# Patient Record
Sex: Male | Born: 1935 | Race: White | Hispanic: No | Smoking: Former smoker
Health system: Southern US, Community
[De-identification: ages and names within clinical notes are randomized; demographics above are authoritative.]

## PROBLEM LIST (undated history)

## (undated) DIAGNOSIS — E119 Type 2 diabetes mellitus without complications: Secondary | ICD-10-CM

## (undated) DIAGNOSIS — L80 Vitiligo: Secondary | ICD-10-CM

## (undated) DIAGNOSIS — D649 Anemia, unspecified: Secondary | ICD-10-CM

## (undated) DIAGNOSIS — G709 Myoneural disorder, unspecified: Secondary | ICD-10-CM

## (undated) DIAGNOSIS — K219 Gastro-esophageal reflux disease without esophagitis: Secondary | ICD-10-CM

## (undated) DIAGNOSIS — E785 Hyperlipidemia, unspecified: Secondary | ICD-10-CM

## (undated) DIAGNOSIS — H6120 Impacted cerumen, unspecified ear: Secondary | ICD-10-CM

## (undated) DIAGNOSIS — J309 Allergic rhinitis, unspecified: Secondary | ICD-10-CM

## (undated) DIAGNOSIS — K625 Hemorrhage of anus and rectum: Secondary | ICD-10-CM

## (undated) DIAGNOSIS — R05 Cough: Secondary | ICD-10-CM

## (undated) DIAGNOSIS — M1990 Unspecified osteoarthritis, unspecified site: Secondary | ICD-10-CM

## (undated) DIAGNOSIS — I10 Essential (primary) hypertension: Secondary | ICD-10-CM

## (undated) DIAGNOSIS — R972 Elevated prostate specific antigen [PSA]: Secondary | ICD-10-CM

## (undated) DIAGNOSIS — Z8719 Personal history of other diseases of the digestive system: Secondary | ICD-10-CM

## (undated) DIAGNOSIS — R339 Retention of urine, unspecified: Secondary | ICD-10-CM

## (undated) DIAGNOSIS — R259 Unspecified abnormal involuntary movements: Secondary | ICD-10-CM

## (undated) DIAGNOSIS — K4090 Unilateral inguinal hernia, without obstruction or gangrene, not specified as recurrent: Secondary | ICD-10-CM

## (undated) DIAGNOSIS — C4490 Unspecified malignant neoplasm of skin, unspecified: Secondary | ICD-10-CM

## (undated) DIAGNOSIS — J45909 Unspecified asthma, uncomplicated: Secondary | ICD-10-CM

## (undated) HISTORY — DX: Personal history of other diseases of the digestive system: Z87.19

## (undated) HISTORY — DX: Cough: R05

## (undated) HISTORY — DX: Impacted cerumen, unspecified ear: H61.20

## (undated) HISTORY — DX: Anemia, unspecified: D64.9

## (undated) HISTORY — DX: Unspecified abnormal involuntary movements: R25.9

## (undated) HISTORY — DX: Allergic rhinitis, unspecified: J30.9

## (undated) HISTORY — DX: Unspecified asthma, uncomplicated: J45.909

## (undated) HISTORY — DX: Vitiligo: L80

## (undated) HISTORY — DX: Type 2 diabetes mellitus without complications: E11.9

## (undated) HISTORY — DX: Hyperlipidemia, unspecified: E78.5

## (undated) HISTORY — DX: Essential (primary) hypertension: I10

## (undated) HISTORY — PX: CATARACT EXTRACTION: SUR2

## (undated) HISTORY — DX: Unspecified malignant neoplasm of skin, unspecified: C44.90

## (undated) HISTORY — DX: Gastro-esophageal reflux disease without esophagitis: K21.9

## (undated) HISTORY — DX: Unspecified osteoarthritis, unspecified site: M19.90

## (undated) HISTORY — DX: Unilateral inguinal hernia, without obstruction or gangrene, not specified as recurrent: K40.90

## (undated) HISTORY — DX: Retention of urine, unspecified: R33.9

## (undated) HISTORY — DX: Elevated prostate specific antigen (PSA): R97.20

## (undated) HISTORY — DX: Hemorrhage of anus and rectum: K62.5

## (undated) HISTORY — DX: Myoneural disorder, unspecified: G70.9

---
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
## 1958-02-25 HISTORY — PX: INNER EAR SURGERY: SHX679

## 2004-01-12 ENCOUNTER — Ambulatory Visit: Payer: Self-pay | Admitting: Endocrinology

## 2004-01-13 ENCOUNTER — Ambulatory Visit: Payer: Self-pay | Admitting: Endocrinology

## 2004-04-02 ENCOUNTER — Ambulatory Visit: Payer: Self-pay | Admitting: Endocrinology

## 2004-10-09 ENCOUNTER — Ambulatory Visit: Payer: Self-pay | Admitting: Endocrinology

## 2004-10-19 ENCOUNTER — Ambulatory Visit: Payer: Self-pay

## 2004-11-23 ENCOUNTER — Ambulatory Visit: Payer: Self-pay | Admitting: Endocrinology

## 2004-12-10 ENCOUNTER — Ambulatory Visit: Payer: Self-pay | Admitting: Cardiology

## 2005-01-09 ENCOUNTER — Ambulatory Visit: Payer: Self-pay | Admitting: Endocrinology

## 2005-04-17 ENCOUNTER — Ambulatory Visit: Payer: Self-pay | Admitting: Endocrinology

## 2005-04-23 ENCOUNTER — Ambulatory Visit: Payer: Self-pay | Admitting: Endocrinology

## 2005-06-11 ENCOUNTER — Ambulatory Visit: Payer: Self-pay | Admitting: Cardiology

## 2005-10-01 ENCOUNTER — Ambulatory Visit: Payer: Self-pay | Admitting: Endocrinology

## 2005-10-04 ENCOUNTER — Ambulatory Visit: Payer: Self-pay | Admitting: Endocrinology

## 2005-11-28 ENCOUNTER — Ambulatory Visit: Payer: Self-pay | Admitting: Cardiology

## 2006-01-21 ENCOUNTER — Ambulatory Visit: Payer: Self-pay | Admitting: Endocrinology

## 2006-03-14 ENCOUNTER — Ambulatory Visit: Payer: Self-pay | Admitting: Endocrinology

## 2006-05-08 ENCOUNTER — Ambulatory Visit: Payer: Self-pay | Admitting: Endocrinology

## 2006-05-08 LAB — CONVERTED CEMR LAB
ALT: 49 units/L — ABNORMAL HIGH (ref 0–40)
Albumin: 3.8 g/dL (ref 3.5–5.2)
BUN: 36 mg/dL — ABNORMAL HIGH (ref 6–23)
Basophils Relative: 2.2 % — ABNORMAL HIGH (ref 0.0–1.0)
CO2: 26 meq/L (ref 19–32)
Chloride: 108 meq/L (ref 96–112)
Cholesterol: 121 mg/dL (ref 0–200)
Creatinine, Ser: 1.3 mg/dL (ref 0.4–1.5)
GFR calc Af Amer: 69 mL/min
GFR calc non Af Amer: 57 mL/min
Glucose, Bld: 104 mg/dL — ABNORMAL HIGH (ref 70–99)
LDL Cholesterol: 62 mg/dL (ref 0–99)
Leukocytes, UA: NEGATIVE
Lymphocytes Relative: 23.7 % (ref 12.0–46.0)
MCV: 91.6 fL (ref 78.0–100.0)
Microalb Creat Ratio: 57.4 mg/g — ABNORMAL HIGH (ref 0.0–30.0)
Monocytes Relative: 10.1 % (ref 3.0–11.0)
Neutro Abs: 3.9 10*3/uL (ref 1.4–7.7)
Neutrophils Relative %: 49.5 % (ref 43.0–77.0)
Nitrite: NEGATIVE
Platelets: 235 10*3/uL (ref 150–400)
RBC: 4.56 M/uL (ref 4.22–5.81)
RDW: 12.9 % (ref 11.5–14.6)
Sodium: 142 meq/L (ref 135–145)
TSH: 1.82 microintl units/mL (ref 0.35–5.50)
Total CHOL/HDL Ratio: 3.5
Triglycerides: 121 mg/dL (ref 0–149)
Urine Glucose: NEGATIVE mg/dL
Urobilinogen, UA: 0.2 (ref 0.0–1.0)

## 2006-05-13 ENCOUNTER — Ambulatory Visit: Payer: Self-pay | Admitting: Endocrinology

## 2006-06-16 ENCOUNTER — Ambulatory Visit: Payer: Self-pay | Admitting: Cardiology

## 2006-09-22 ENCOUNTER — Encounter: Payer: Self-pay | Admitting: Endocrinology

## 2006-09-22 DIAGNOSIS — J309 Allergic rhinitis, unspecified: Secondary | ICD-10-CM | POA: Insufficient documentation

## 2006-09-22 DIAGNOSIS — E785 Hyperlipidemia, unspecified: Secondary | ICD-10-CM | POA: Insufficient documentation

## 2006-09-22 DIAGNOSIS — D649 Anemia, unspecified: Secondary | ICD-10-CM

## 2006-09-22 DIAGNOSIS — K219 Gastro-esophageal reflux disease without esophagitis: Secondary | ICD-10-CM | POA: Insufficient documentation

## 2006-09-22 DIAGNOSIS — Z8719 Personal history of other diseases of the digestive system: Secondary | ICD-10-CM | POA: Insufficient documentation

## 2006-09-22 DIAGNOSIS — I10 Essential (primary) hypertension: Secondary | ICD-10-CM

## 2006-09-22 DIAGNOSIS — E119 Type 2 diabetes mellitus without complications: Secondary | ICD-10-CM

## 2006-09-22 HISTORY — DX: Essential (primary) hypertension: I10

## 2006-09-22 HISTORY — DX: Type 2 diabetes mellitus without complications: E11.9

## 2006-09-22 HISTORY — DX: Anemia, unspecified: D64.9

## 2006-09-22 HISTORY — DX: Gastro-esophageal reflux disease without esophagitis: K21.9

## 2006-09-22 HISTORY — DX: Allergic rhinitis, unspecified: J30.9

## 2006-09-22 HISTORY — DX: Hyperlipidemia, unspecified: E78.5

## 2006-09-22 HISTORY — DX: Personal history of other diseases of the digestive system: Z87.19

## 2006-12-05 ENCOUNTER — Ambulatory Visit: Payer: Self-pay | Admitting: Cardiology

## 2007-04-14 ENCOUNTER — Encounter: Payer: Self-pay | Admitting: Endocrinology

## 2007-04-21 ENCOUNTER — Telehealth (INDEPENDENT_AMBULATORY_CARE_PROVIDER_SITE_OTHER): Payer: Self-pay | Admitting: *Deleted

## 2007-04-23 ENCOUNTER — Ambulatory Visit: Payer: Self-pay | Admitting: Endocrinology

## 2007-04-23 DIAGNOSIS — R259 Unspecified abnormal involuntary movements: Secondary | ICD-10-CM | POA: Insufficient documentation

## 2007-04-23 HISTORY — DX: Unspecified abnormal involuntary movements: R25.9

## 2007-04-29 LAB — CONVERTED CEMR LAB
AST: 30 units/L (ref 0–37)
BUN: 33 mg/dL — ABNORMAL HIGH (ref 6–23)
Calcium: 9 mg/dL (ref 8.4–10.5)
Creatinine, Ser: 1.3 mg/dL (ref 0.4–1.5)
Creatinine,U: 79.6 mg/dL
Direct LDL: 80.9 mg/dL
GFR calc non Af Amer: 57 mL/min
Glucose, Bld: 100 mg/dL — ABNORMAL HIGH (ref 70–99)
Microalb Creat Ratio: 228.6 mg/g — ABNORMAL HIGH (ref 0.0–30.0)
Potassium: 4.7 meq/L (ref 3.5–5.1)
Sodium: 140 meq/L (ref 135–145)
Total Bilirubin: 0.8 mg/dL (ref 0.3–1.2)

## 2007-06-12 ENCOUNTER — Encounter: Payer: Self-pay | Admitting: Endocrinology

## 2007-07-22 ENCOUNTER — Ambulatory Visit: Payer: Self-pay | Admitting: Cardiology

## 2007-10-19 ENCOUNTER — Encounter: Payer: Self-pay | Admitting: Endocrinology

## 2007-10-19 ENCOUNTER — Telehealth: Payer: Self-pay | Admitting: Endocrinology

## 2007-11-06 ENCOUNTER — Ambulatory Visit: Payer: Self-pay | Admitting: Endocrinology

## 2007-11-06 DIAGNOSIS — M1990 Unspecified osteoarthritis, unspecified site: Secondary | ICD-10-CM

## 2007-11-06 DIAGNOSIS — H6120 Impacted cerumen, unspecified ear: Secondary | ICD-10-CM | POA: Insufficient documentation

## 2007-11-06 DIAGNOSIS — L80 Vitiligo: Secondary | ICD-10-CM

## 2007-11-06 DIAGNOSIS — R972 Elevated prostate specific antigen [PSA]: Secondary | ICD-10-CM | POA: Insufficient documentation

## 2007-11-06 HISTORY — DX: Elevated prostate specific antigen (PSA): R97.20

## 2007-11-06 HISTORY — DX: Unspecified osteoarthritis, unspecified site: M19.90

## 2007-11-06 HISTORY — DX: Vitiligo: L80

## 2007-11-06 HISTORY — DX: Impacted cerumen, unspecified ear: H61.20

## 2007-11-07 LAB — CONVERTED CEMR LAB
ALT: 19 units/L (ref 0–53)
Alkaline Phosphatase: 55 units/L (ref 39–117)
BUN: 29 mg/dL — ABNORMAL HIGH (ref 6–23)
Bacteria, UA: NEGATIVE
Basophils Absolute: 0.1 10*3/uL (ref 0.0–0.1)
Basophils Relative: 1.1 % (ref 0.0–3.0)
Bilirubin Urine: NEGATIVE
Bilirubin, Direct: 0.1 mg/dL (ref 0.0–0.3)
CO2: 33 meq/L — ABNORMAL HIGH (ref 19–32)
Calcium: 9.2 mg/dL (ref 8.4–10.5)
Chloride: 99 meq/L (ref 96–112)
Cholesterol: 152 mg/dL (ref 0–200)
Creatinine, Ser: 1.2 mg/dL (ref 0.4–1.5)
Creatinine,U: 72.8 mg/dL
Crystals: NEGATIVE
Direct LDL: 65.8 mg/dL
Folate: >20 ng/mL
GFR calc non Af Amer: 62 mL/min
HCT: 46 % (ref 39.0–52.0)
Leukocytes, UA: NEGATIVE
Lymphocytes Relative: 23.9 % (ref 12.0–46.0)
MCHC: 33.9 g/dL (ref 30.0–36.0)
Neutrophils Relative %: 48.9 % (ref 43.0–77.0)
Nitrite: NEGATIVE
Platelets: 239 10*3/uL (ref 150–400)
RDW: 12.9 % (ref 11.5–14.6)
TSH: 1.14 microintl units/mL (ref 0.35–5.50)
Total Protein: 8 g/dL (ref 6.0–8.3)
Triglycerides: 245 mg/dL (ref 0–149)
Uric Acid, Serum: 6.7 mg/dL (ref 4.0–7.8)
Urine Glucose: NEGATIVE mg/dL
Urobilinogen, UA: 0.2 (ref 0.0–1.0)
VLDL: 49 mg/dL — ABNORMAL HIGH (ref 0–40)
pH: 7 (ref 5.0–8.0)

## 2007-11-13 ENCOUNTER — Encounter: Payer: Self-pay | Admitting: Endocrinology

## 2007-11-25 ENCOUNTER — Telehealth: Payer: Self-pay | Admitting: Endocrinology

## 2007-12-25 ENCOUNTER — Ambulatory Visit: Payer: Self-pay | Admitting: Endocrinology

## 2007-12-31 ENCOUNTER — Ambulatory Visit: Payer: Self-pay | Admitting: Cardiology

## 2008-02-01 ENCOUNTER — Ambulatory Visit: Payer: Self-pay | Admitting: Endocrinology

## 2008-02-01 DIAGNOSIS — R059 Cough, unspecified: Secondary | ICD-10-CM | POA: Insufficient documentation

## 2008-02-01 DIAGNOSIS — R05 Cough: Secondary | ICD-10-CM | POA: Insufficient documentation

## 2008-02-01 HISTORY — DX: Cough, unspecified: R05.9

## 2008-04-27 ENCOUNTER — Encounter: Payer: Self-pay | Admitting: Endocrinology

## 2008-05-20 ENCOUNTER — Encounter: Payer: Self-pay | Admitting: Endocrinology

## 2008-06-30 ENCOUNTER — Encounter: Payer: Self-pay | Admitting: Endocrinology

## 2008-10-25 ENCOUNTER — Encounter (INDEPENDENT_AMBULATORY_CARE_PROVIDER_SITE_OTHER): Payer: Self-pay | Admitting: *Deleted

## 2008-11-17 ENCOUNTER — Encounter: Payer: Self-pay | Admitting: Endocrinology

## 2008-12-07 ENCOUNTER — Ambulatory Visit: Payer: Self-pay | Admitting: Endocrinology

## 2008-12-19 ENCOUNTER — Emergency Department (HOSPITAL_COMMUNITY): Admission: EM | Admit: 2008-12-19 | Discharge: 2008-12-19 | Payer: Self-pay | Admitting: Emergency Medicine

## 2008-12-20 ENCOUNTER — Ambulatory Visit: Payer: Self-pay | Admitting: Endocrinology

## 2008-12-20 DIAGNOSIS — K4090 Unilateral inguinal hernia, without obstruction or gangrene, not specified as recurrent: Secondary | ICD-10-CM | POA: Insufficient documentation

## 2008-12-20 HISTORY — DX: Unilateral inguinal hernia, without obstruction or gangrene, not specified as recurrent: K40.90

## 2008-12-21 LAB — CONVERTED CEMR LAB
AST: 23 units/L (ref 0–37)
BUN: 35 mg/dL — ABNORMAL HIGH (ref 6–23)
Basophils Absolute: 0 10*3/uL (ref 0.0–0.1)
Basophils Relative: 0.7 % (ref 0.0–3.0)
Bilirubin, Direct: 0.1 mg/dL (ref 0.0–0.3)
CO2: 28 meq/L (ref 19–32)
Calcium: 9 mg/dL (ref 8.4–10.5)
Cholesterol: 137 mg/dL (ref 0–200)
Creatinine, Ser: 1.5 mg/dL (ref 0.4–1.5)
Eosinophils Absolute: 0.4 10*3/uL (ref 0.0–0.7)
Eosinophils Relative: 5.6 % — ABNORMAL HIGH (ref 0.0–5.0)
Folate: >20 ng/mL
GFR calc non Af Amer: 47.73 mL/min (ref >60)
HDL: 34.2 mg/dL — ABNORMAL LOW (ref >39.00)
Iron: 111 ug/dL (ref 42–165)
LDL Cholesterol: 70 mg/dL (ref 0–99)
Microalb Creat Ratio: 25.2 mg/g (ref 0.0–30.0)
Monocytes Absolute: 0.7 10*3/uL (ref 0.1–1.0)
PSA: 5.21 ng/mL — ABNORMAL HIGH (ref 0.10–4.00)
Platelets: 186 10*3/uL (ref 150.0–400.0)
RBC: 4.25 M/uL (ref 4.22–5.81)
RDW: 12.6 % (ref 11.5–14.6)
Specific Gravity, Urine: 1.015 (ref 1.000–1.030)
TSH: 1.33 microintl units/mL (ref 0.35–5.50)
Total CHOL/HDL Ratio: 4
Total Protein, Urine: NEGATIVE mg/dL
Urine Glucose: NEGATIVE mg/dL
Vitamin B-12: 566 pg/mL (ref 211–911)
pH: 6.5 (ref 5.0–8.0)

## 2008-12-30 ENCOUNTER — Ambulatory Visit: Payer: Self-pay | Admitting: Endocrinology

## 2009-01-02 ENCOUNTER — Telehealth: Payer: Self-pay | Admitting: Endocrinology

## 2009-01-03 ENCOUNTER — Telehealth: Payer: Self-pay | Admitting: Endocrinology

## 2009-01-04 ENCOUNTER — Encounter: Payer: Self-pay | Admitting: Endocrinology

## 2009-01-04 DIAGNOSIS — C4490 Unspecified malignant neoplasm of skin, unspecified: Secondary | ICD-10-CM

## 2009-01-04 HISTORY — DX: Unspecified malignant neoplasm of skin, unspecified: C44.90

## 2009-01-06 ENCOUNTER — Encounter (INDEPENDENT_AMBULATORY_CARE_PROVIDER_SITE_OTHER): Payer: Self-pay | Admitting: *Deleted

## 2009-01-11 ENCOUNTER — Telehealth (INDEPENDENT_AMBULATORY_CARE_PROVIDER_SITE_OTHER): Payer: Self-pay | Admitting: *Deleted

## 2009-01-16 ENCOUNTER — Telehealth: Payer: Self-pay | Admitting: Internal Medicine

## 2009-01-16 ENCOUNTER — Encounter: Payer: Self-pay | Admitting: Endocrinology

## 2009-01-23 ENCOUNTER — Ambulatory Visit: Payer: Self-pay | Admitting: Cardiology

## 2009-01-30 ENCOUNTER — Encounter: Payer: Self-pay | Admitting: Endocrinology

## 2009-02-08 ENCOUNTER — Ambulatory Visit (HOSPITAL_COMMUNITY): Admission: RE | Admit: 2009-02-08 | Discharge: 2009-02-08 | Payer: Self-pay | Admitting: Surgery

## 2009-06-22 ENCOUNTER — Ambulatory Visit: Payer: Self-pay | Admitting: Endocrinology

## 2009-06-22 DIAGNOSIS — M542 Cervicalgia: Secondary | ICD-10-CM | POA: Insufficient documentation

## 2009-07-21 ENCOUNTER — Encounter: Payer: Self-pay | Admitting: Endocrinology

## 2009-07-25 ENCOUNTER — Telehealth (INDEPENDENT_AMBULATORY_CARE_PROVIDER_SITE_OTHER): Payer: Self-pay | Admitting: *Deleted

## 2009-09-28 ENCOUNTER — Telehealth: Payer: Self-pay | Admitting: Endocrinology

## 2009-09-29 ENCOUNTER — Ambulatory Visit: Payer: Self-pay | Admitting: Endocrinology

## 2009-10-04 ENCOUNTER — Encounter: Payer: Self-pay | Admitting: Endocrinology

## 2009-10-05 ENCOUNTER — Telehealth: Payer: Self-pay | Admitting: Endocrinology

## 2009-10-19 ENCOUNTER — Encounter: Payer: Self-pay | Admitting: Endocrinology

## 2010-01-04 ENCOUNTER — Encounter: Payer: Self-pay | Admitting: Endocrinology

## 2010-03-21 ENCOUNTER — Encounter: Payer: Self-pay | Admitting: Endocrinology

## 2010-03-21 ENCOUNTER — Ambulatory Visit
Admission: RE | Admit: 2010-03-21 | Discharge: 2010-03-21 | Payer: Self-pay | Source: Home / Self Care | Attending: Endocrinology | Admitting: Endocrinology

## 2010-03-26 ENCOUNTER — Emergency Department (HOSPITAL_COMMUNITY)
Admission: EM | Admit: 2010-03-26 | Discharge: 2010-03-26 | Payer: Self-pay | Source: Home / Self Care | Admitting: Emergency Medicine

## 2010-03-26 ENCOUNTER — Encounter (INDEPENDENT_AMBULATORY_CARE_PROVIDER_SITE_OTHER): Payer: Self-pay | Admitting: *Deleted

## 2010-03-26 LAB — URINALYSIS, ROUTINE W REFLEX MICROSCOPIC
Bilirubin Urine: NEGATIVE
Hgb urine dipstick: NEGATIVE
Protein, ur: 100 mg/dL — AB
Urine Glucose, Fasting: NEGATIVE mg/dL
Urobilinogen, UA: 0.2 mg/dL (ref 0.0–1.0)

## 2010-03-26 LAB — COMPREHENSIVE METABOLIC PANEL
AST: 19 U/L (ref 0–37)
Albumin: 3.7 g/dL (ref 3.5–5.2)
Alkaline Phosphatase: 62 U/L (ref 39–117)
BUN: 21 mg/dL (ref 6–23)
CO2: 26 mEq/L (ref 19–32)
Chloride: 107 mEq/L (ref 96–112)
GFR calc Af Amer: >60 mL/min (ref >60)
GFR calc non Af Amer: 57 mL/min — ABNORMAL LOW (ref >60)
Potassium: 3.7 mEq/L (ref 3.5–5.1)
Total Bilirubin: 0.9 mg/dL (ref 0.3–1.2)

## 2010-03-26 LAB — DIFFERENTIAL
Basophils Absolute: 0 10*3/uL (ref 0.0–0.1)
Basophils Relative: 0 % (ref 0–1)
Lymphocytes Relative: 14 % (ref 12–46)
Monocytes Relative: 14 % — ABNORMAL HIGH (ref 3–12)
Neutro Abs: 7.6 10*3/uL (ref 1.7–7.7)
Neutrophils Relative %: 68 % (ref 43–77)

## 2010-03-26 LAB — CBC
Hemoglobin: 14.9 g/dL (ref 13.0–17.0)
MCV: 92 fL (ref 78.0–100.0)
Platelets: 206 10*3/uL (ref 150–400)
RBC: 4.63 MIL/uL (ref 4.22–5.81)
WBC: 11.3 10*3/uL — ABNORMAL HIGH (ref 4.0–10.5)

## 2010-03-29 ENCOUNTER — Encounter: Payer: Self-pay | Admitting: Endocrinology

## 2010-03-29 ENCOUNTER — Ambulatory Visit (INDEPENDENT_AMBULATORY_CARE_PROVIDER_SITE_OTHER): Payer: Medicare Other | Admitting: Endocrinology

## 2010-03-29 DIAGNOSIS — R109 Unspecified abdominal pain: Secondary | ICD-10-CM

## 2010-03-29 NOTE — Miscellaneous (Signed)
  Medications Added VYTORIN 10-20 MG TABS (EZETIMIBE-SIMVASTATIN) 1 once daily       Clinical Lists Changes  Medications: Removed medication of VYTORIN 10-80 MG  TABS (EZETIMIBE-SIMVASTATIN) take 1 by mouth qd Added new medication of VYTORIN 10-20 MG TABS (EZETIMIBE-SIMVASTATIN) 1 once daily - Signed Rx of VYTORIN 10-20 MG TABS (EZETIMIBE-SIMVASTATIN) 1 once daily;  #30 x 11;  Signed;  Entered by: Minus Breeding MD;  Authorized by: Minus Breeding MD;  Method used: Electronically to [facility] [address] [phone]*, [address] [place], [place], [place]  [address], Ph: [phone] or [phone], Fax: [phone]    Prescriptions: VYTORIN 10-20 MG TABS (EZETIMIBE-SIMVASTATIN) 1 once daily  #30 x 11   Entered and Authorized by:   Minus Breeding MD   Signed by:   Minus Breeding MD on 10/04/2009   Method used:   Electronically to        [facility] [address]* (retail)       [address]       [place], [place]  [address]       Ph: [phone] or [phone]       Fax: [phone]   RxID:   [id number]

## 2010-03-29 NOTE — Progress Notes (Signed)
Summary: [facility]  Phone Note Outgoing Call   Summary of Call: Faxed completed paperwork to [facility] medical and sent a copy to be scanned. Initial call taken by: Josph Macho RMA,  Jul 25, 2009 1:04 PM

## 2010-03-29 NOTE — Medication Information (Signed)
Summary: Glucose Testing/[facility]  Glucose Testing/[facility]   Imported By: Sherian Rein 07/27/2009 11:50:08  _____________________________________________________________________  External Attachment:    Type:   Image     Comment:   External Document

## 2010-03-29 NOTE — Letter (Signed)
Summary: [facility]  [facility]   Imported By: Lennie Odor 01/09/2010 10:50:04  _____________________________________________________________________  External Attachment:    Type:   Image     Comment:   External Document

## 2010-03-29 NOTE — Progress Notes (Signed)
Summary: Vytorin  Phone Note Outgoing Call   Call placed by: Brenton Grills MA,  October 05, 2009 9:09 AM Summary of Call: Called pt to inform that medication dosage was changed due to possible drug interaction (Vytorin 10-20mg .) Also informed pt that new rx was sent in to pharmacy and notified pharmacy as well. Pt verbalized understanding.

## 2010-03-29 NOTE — Letter (Signed)
Summary: [facility]   Imported By: Sherian Rein 10/26/2009 14:07:55  _____________________________________________________________________  External Attachment:    Type:   Image     Comment:   External Document

## 2010-03-29 NOTE — Assessment & Plan Note (Signed)
Summary: FOLLOW UP-LB   Vital Signs:  Patient profile:   77 year old male Height:      69 inches (175.26 cm) Weight:      159.13 pounds (72.33 kg) BMI:     23.58 O2 Sat:      98 % on Room air Temp:     97.2 degrees F (36.22 degrees C) oral Pulse rate:   59 / minute BP sitting:   112 / 78  (left arm) Cuff size:   regular  Vitals Entered By: Brenton Grills MA (September 29, 2009 10:36 AM)  O2 Flow:  Room air CC: F/U appt/aj Is Patient Diabetic? Yes   Primary Provider:  Minus Breeding MD  CC:  F/U appt/aj.  History of Present Illness: 1 week of productive-quality cough in the chest, but no associated sob.  no sore throat or earache.  he has slight nasal congestion.  Current Medications (verified): 1)  Fish Oil 1200 Mg  Caps (Omega-3 Fatty Acids) .... Take 1 By Mouth Bid 2)  Actos 30 Mg  Tabs (Pioglitazone Hcl) .... Take 1 By Mouth Qd 3)  Lisinopril-Hydrochlorothiazide 20-12.5 Mg  Tabs (Lisinopril-Hydrochlorothiazide) .... Take 1 By Mouth Qd 4)  Flomax 0.4 Mg  Cp24 (Tamsulosin Hcl) .... Take 1 By Mouth Bid 5)  Vytorin 10-80 Mg  Tabs (Ezetimibe-Simvastatin) .... Take 1 By Mouth Qd 6)  Topiramate 25 Mg Tabs (Topiramate) .... 4 Tabs Twice Daily 7)  Metoprolol Succinate 100 Mg Xr24h-Tab (Metoprolol Succinate) .... 1/2 Qd 8)  Celebrex 200 Mg Caps (Celecoxib) .[name] Kitchen.. 1 Qd 9)  Amlodipine Besylate 5 Mg Tabs (Amlodipine Besylate) .[name] Kitchen.. 1 Qd 10)  Aspirin 81 Mg  Tabs (Aspirin) .[name] Kitchen.. 1 By Mouth Daily 11)  Tramadol Hcl 50 Mg Tabs (Tramadol Hcl) .[name] Kitchen.. 1 Every 4 Hrn As Needed For Pain  Allergies (verified): 1)  ! Septra 2)  ! Niacin  Past History:  Past Medical History: Last updated: 01/23/2009 HYPERTENSION (ICD-401.9) HYPERLIPIDEMIA (ICD-272.4) GERD (ICD-530.81) DIABETES MELLITUS, TYPE II (ICD-250.00) CARCINOMA, SKIN, SQUAMOUS CELL (ICD-173.9) INGUINAL HERNIA, RIGHT (ICD-550.90) COUGH (ICD-786.2) ROUTINE GENERAL MEDICAL EXAM@HEALTH  CARE FACL (ICD-V70.0) IMPACTED CERUMEN  (ICD-380.4) OSTEOARTHRITIS (ICD-715.90) PSA, INCREASED (ICD-790.93) VITILIGO (ICD-709.01) TREMOR (ICD-781.0) ENCOUNTER FOR LONG-TERM USE OF OTHER MEDICATIONS (ICD-V58.69) DIVERTICULITIS, HX OF (ICD-V12.79) ANEMIA-NOS (ICD-285.9) ALLERGIC RHINITIS (ICD-477.9)  Review of Systems  The patient denies fever.    Physical Exam  Head:  head: no deformity eyes: no periorbital swelling, no proptosis external nose and ears are normal mouth: no lesion seen Lungs:  Clear to auscultation bilaterally. Normal respiratory effort.    Impression & Recommendations:  Problem # 1:  COUGH (ICD-786.2) due to acute bronchitis  Medications Added to Medication List This Visit: 1)  Cefuroxime Axetil 500 Mg Tabs (Cefuroxime axetil) .... 1/2 tab two times a day  Other Orders: T-2 View CXR (71020TC) Est. Patient Level III ([address])  Patient Instructions: 1)  Please schedule a physical appointment in 3 months. 2)  cefuroxime 1/2 of 500 mg two times a day. 3)  chest x ray today.  please call [phone] to hear your test results. Prescriptions: CEFUROXIME AXETIL 500 MG TABS (CEFUROXIME AXETIL) 1/2 tab two times a day  #7 x 0   Entered and Authorized by:   Minus Breeding MD   Signed by:   Minus Breeding MD on 09/29/2009   Method used:   Electronically to        [facility] # [phone]* (retail)  [address] [place], [place]  [address]       Ph: [phone]       Fax: [phone]   RxID:   [id number]

## 2010-03-29 NOTE — Progress Notes (Signed)
Summary: OV due  Phone Note Outgoing Call   Call placed by: Brenton Grills MA,  September 28, 2009 11:19 AM Details for Reason: OV due Summary of Call: Per MD, pt is due for OV. Called pt to set up OV.  Follow-up for Phone Call        Appointment scheduled 09/29/2009 10:15 am Follow-up by: Brenton Grills MA,  September 28, 2009 11:20 AM

## 2010-03-29 NOTE — Assessment & Plan Note (Signed)
Summary: NECK PAIN/NWS   Vital Signs:  Patient profile:   77 year old male Height:      69 inches (175.26 cm) Weight:      162.38 pounds (73.81 kg) O2 Sat:      97 % on Room air Temp:     97.5 degrees F (36.39 degrees C) oral Pulse rate:   60 / minute BP sitting:   124 / 70  (left arm) Cuff size:   regular  Vitals Entered By: Josph Macho RMA (June 22, 2009 1:13 PM)  O2 Flow:  Room air CC: Neck Pain X2 weeks/ CF Is Patient Diabetic? Yes   Primary Provider:  Minus Breeding MD  CC:  Neck Pain X2 weeks/ CF.  History of Present Illness: pt states 2 weeks of moderate pain at the back of the neck.  it is worse in the context of twisting the neck.  no associated numbness. no cbg record, but states cbg's are well-controlled.  Current Medications (verified): 1)  Fish Oil 1200 Mg  Caps (Omega-3 Fatty Acids) .... Take 1 By Mouth Bid 2)  Actos 30 Mg  Tabs (Pioglitazone Hcl) .... Take 1 By Mouth Qd 3)  Lisinopril-Hydrochlorothiazide 20-12.5 Mg  Tabs (Lisinopril-Hydrochlorothiazide) .... Take 1 By Mouth Qd 4)  Flomax 0.4 Mg  Cp24 (Tamsulosin Hcl) .... Take 1 By Mouth Bid 5)  Vytorin 10-80 Mg  Tabs (Ezetimibe-Simvastatin) .... Take 1 By Mouth Qd 6)  Topiramate 25 Mg Tabs (Topiramate) .... 4 Tabs Twice Daily 7)  Metoprolol Succinate 100 Mg Xr24h-Tab (Metoprolol Succinate) .... 1/2 Qd 8)  Celebrex 200 Mg Caps (Celecoxib) .[name] Kitchen.. 1 Qd 9)  Amlodipine Besylate 5 Mg Tabs (Amlodipine Besylate) .[name] Kitchen.. 1 Qd 10)  Aspirin 81 Mg  Tabs (Aspirin) .[name] Kitchen.. 1 By Mouth Daily  Allergies (verified): 1)  ! Septra 2)  ! Niacin  Past History:  Past Medical History: Last updated: 01/23/2009 HYPERTENSION (ICD-401.9) HYPERLIPIDEMIA (ICD-272.4) GERD (ICD-530.81) DIABETES MELLITUS, TYPE II (ICD-250.00) CARCINOMA, SKIN, SQUAMOUS CELL (ICD-173.9) INGUINAL HERNIA, RIGHT (ICD-550.90) COUGH (ICD-786.2) ROUTINE GENERAL MEDICAL EXAM@HEALTH  CARE FACL (ICD-V70.0) IMPACTED CERUMEN (ICD-380.4) OSTEOARTHRITIS  (ICD-715.90) PSA, INCREASED (ICD-790.93) VITILIGO (ICD-709.01) TREMOR (ICD-781.0) ENCOUNTER FOR LONG-TERM USE OF OTHER MEDICATIONS (ICD-V58.69) DIVERTICULITIS, HX OF (ICD-V12.79) ANEMIA-NOS (ICD-285.9) ALLERGIC RHINITIS (ICD-477.9)  Review of Systems  The patient denies weight loss and weight gain.    Physical Exam  General:  elderly, frail, no distress  Neck:  Supple without thyroid enlargement or tenderness. No cervical lymphadenopathy, neck masses or tracheal deviation.  full rom, but rom is painful Msk:  strength is decreased on the hands bilaterally (chronic). Neurologic:  sensation is intact to touch on the ue's.   Impression & Recommendations:  Problem # 1:  NECK PAIN (ICD-723.1) Assessment New  Problem # 2:  DIABETES MELLITUS, TYPE II (ICD-250.00) ? control  Medications Added to Medication List This Visit: 1)  Tramadol Hcl 50 Mg Tabs (Tramadol hcl) .[name] Kitchen.. 1 every 4 hrn as needed for pain  Other Orders: TLB-Sedimentation Rate (ESR) (85652-ESR) TLB-A1C / Hgb A1C (Glycohemoglobin) (83036-A1C) T-Cervicle Spine 2-3 Views (72040TC) Est. Patient Level IV (536[address]) Prescription Created Electronically [phone])  Patient Instructions: 1)  x rays and blood tests today. 2)  tests are being ordered for you today.  a few days after the test(s), please call [phone] to hear your test results. 3)  tramadol 50 mg every 4 hrs as needed for pain. 4)  (update: i left message on phone-tree:  call if you need a stronger pain med). Prescriptions:  TRAMADOL HCL 50 MG TABS (TRAMADOL HCL) 1 every 4 hrn as needed for pain  #50 x 3   Entered and Authorized by:   Minus Breeding MD   Signed by:   Minus Breeding MD on 06/22/2009   Method used:   Electronically to        [facility]* (retail)       [address]       [place], [place]  [address]       Ph: [phone] or [phone]       Fax: [phone]   RxID:   [id number]

## 2010-04-04 ENCOUNTER — Encounter (INDEPENDENT_AMBULATORY_CARE_PROVIDER_SITE_OTHER): Payer: Self-pay | Admitting: *Deleted

## 2010-04-04 ENCOUNTER — Encounter: Payer: Self-pay | Admitting: Endocrinology

## 2010-04-04 ENCOUNTER — Ambulatory Visit (INDEPENDENT_AMBULATORY_CARE_PROVIDER_SITE_OTHER): Payer: Medicare Other | Admitting: Endocrinology

## 2010-04-04 DIAGNOSIS — R339 Retention of urine, unspecified: Secondary | ICD-10-CM

## 2010-04-04 DIAGNOSIS — K625 Hemorrhage of anus and rectum: Secondary | ICD-10-CM

## 2010-04-04 HISTORY — DX: Retention of urine, unspecified: R33.9

## 2010-04-04 HISTORY — DX: Hemorrhage of anus and rectum: K62.5

## 2010-04-04 NOTE — Assessment & Plan Note (Signed)
Summary: over due for office visit per flag/#/cd   Vital Signs:  Patient profile:   77 year old male Height:      69 inches (175.26 cm) Weight:      159 pounds (72.27 kg) O2 Sat:      97 % on Room air Temp:     98.6 degrees F (37.00 degrees C) oral Pulse rate:   67 / minute BP sitting:   130 / 72  (left arm) Cuff size:   regular  Vitals Entered By: Orlan Leavens RMA (March 21, 2010 3:56 PM)  O2 Flow:  Room air CC: follow-up visit Is Patient Diabetic? Yes Did you bring your meter with you today? Yes Pain Assessment Patient in pain? no        Primary Provider:  Minus Breeding MD  CC:  follow-up visit.  History of Present Illness: pt is here for medicare welllness visit.  he denies memory loss and depression.  he says he is able to perform activities of daily living without assistance.  he has no limitations to physical activity.     Current Medications (verified): 1)  Fish Oil 1200 Mg  Caps (Omega-3 Fatty Acids) .... Take 1 By Mouth Bid 2)  Actos 30 Mg  Tabs (Pioglitazone Hcl) .... Take 1 By Mouth Qd 3)  Lisinopril-Hydrochlorothiazide 20-12.5 Mg  Tabs (Lisinopril-Hydrochlorothiazide) .... Take 1 By Mouth Qd 4)  Flomax 0.4 Mg  Cp24 (Tamsulosin Hcl) .... Take 1 By Mouth Bid 5)  Topiramate 25 Mg Tabs (Topiramate) .... 4 Tabs Twice Daily 6)  Metoprolol Succinate 100 Mg Xr24h-Tab (Metoprolol Succinate) .... 1/2 Qd 7)  Celebrex 200 Mg Caps (Celecoxib) .[name] Kitchen.. 1 Qd 8)  Amlodipine Besylate 5 Mg Tabs (Amlodipine Besylate) .[name] Kitchen.. 1 Qd 9)  Aspirin 81 Mg  Tabs (Aspirin) .[name] Kitchen.. 1 By Mouth Daily 10)  Tramadol Hcl 50 Mg Tabs (Tramadol Hcl) .[name] Kitchen.. 1 Every 4 Hrn As Needed For Pain 11)  Vytorin 10-20 Mg Tabs (Ezetimibe-Simvastatin) .[name] Kitchen.. 1 Once Daily  Allergies (verified): 1)  ! Septra 2)  ! Niacin  Past History:  Past Medical History: HYPERTENSION (ICD-401.9) HYPERLIPIDEMIA (ICD-272.4) GERD (ICD-530.81) DIABETES MELLITUS, TYPE II (ICD-250.00) CARCINOMA, SKIN, SQUAMOUS CELL  (ICD-173.9) INGUINAL HERNIA, RIGHT (ICD-550.90) COUGH (ICD-786.2) ROUTINE GENERAL MEDICAL EXAM@HEALTH  CARE FACL (ICD-V70.0) IMPACTED CERUMEN (ICD-380.4) OSTEOARTHRITIS (ICD-715.90) PSA, INCREASED (ICD-790.93) VITILIGO (ICD-709.01) TREMOR (ICD-781.0) ENCOUNTER FOR LONG-TERM USE OF OTHER MEDICATIONS (ICD-V58.69) DIVERTICULITIS, HX OF (ICD-V12.79) ANEMIA-NOS (ICD-285.9) ALLERGIC RHINITIS (ICD-477.9)  optometry: dr Hyacinth Meeker  Family History: Reviewed history from 01/20/2009 and no changes required. Mother deceased at age 80 Stroke Father deceased at 31 Heart attack  Social History: Reviewed history from 01/20/2009 and no changes required. widowed 2008 retired No tobacco use quit at age 26 No drugs or alcohol use pt says he gets exercise.  Review of Systems  The patient denies vision loss and decreased hearing.    Physical Exam  General:  elderly, frail, no distress  Ears:  grossly normal hearing.   Msk:  pt easily and quickly performs "get-up-and-go" from a sitting position  Psych:  remembers 0/3 at 5 minutes.  excellent recall.  can easily read and write a sentence.  alert and oriented x 3, except he says it is 03/16/10.   Impression & Recommendations:  Problem # 1:  ROUTINE GENERAL MEDICAL EXAM@HEALTH  CARE FACL (ICD-V70.0)  Other Orders: TLB-Lipid Panel (80061-LIPID) TLB-BMP (Basic Metabolic Panel-BMET) (80048-METABOL) TLB-CBC Platelet - w/Differential (85025-CBCD) TLB-Hepatic/Liver Function Pnl (80076-HEPATIC) TLB-TSH (Thyroid Stimulating Hormone) (84443-TSH) TLB-A1C / Hgb A1C (  Glycohemoglobin) (83036-A1C) TLB-IBC Pnl (Iron/FE;Transferrin) (83550-IBC) TLB-B12, Serum-Total ONLY ([address]-U04) TLB-Microalbumin/Creat Ratio, Urine (82043-MALB) TLB-PSA (Prostate Specific Antigen) (84153-PSA) TLB-Udip w/ Micro (81001-URINE) EKG w/ Interpretation (93000) Medicare -1st Annual Wellness Visit [phone])   Patient Instructions: 1)  blood tests are being ordered for you  today.  please call [phone] to hear your test results. 2)  please consider these measures for your health:  minimize alcohol.  do not use tobacco products.  have a colonoscopy at least every 10 years from age 74.  keep firearms safely stored.  always use seat belts.  have working smoke alarms in your home.  see an eye doctor and dentist regularly.  never drive under the influence of alcohol or drugs (including prescription drugs).  those with fair skin should take precautions against the sun. 3)  please let me know what your wishes would be, if artificial life support measures should become necessary.  it is critically important to prevent falling down (keep floor areas well-lit, dry, and free of loose objects).  4)  let me know if you decide to take a pill for memory.  i think you should.   5)  here is a list of medicare-covered preventive services. 6)  Please schedule a follow-up appointment in 6 months. 7)  the treatment for your abnormal cardiogram is treatment of your blood pressure. 8)  (we discussed code status.  pt requests full code, but would not want to be started or maintained on artificial life-support measures if there was not a reasonable chance of recovery). Prescriptions: CELEBREX 200 MG CAPS (CELECOXIB) 1 qd  #90 Capsule x 2   Entered and Authorized by:   Minus Breeding MD   Signed by:   Minus Breeding MD on 03/21/2010   Method used:   Electronically to        [facility] # [phone]* (retail)       [address] [place], [place]  [address]       Ph: [phone]       Fax: [phone]   RxID:   [id number] METOPROLOL SUCCINATE 100 MG XR24H-TAB (METOPROLOL SUCCINATE) 1/2 qd  #45 Tablet x 11   Entered and Authorized by:   Minus Breeding MD   Signed by:   Minus Breeding MD on 03/21/2010   Method used:   Electronically to        [facility] # [phone]* (retail)       [address] [place], [place]  [address]       Ph: [phone]       Fax:  [phone]   RxID:   [id number]    Orders Added: 1)  TLB-Lipid Panel [80061-LIPID] 2)  TLB-BMP (Basic Metabolic Panel-BMET) [80048-METABOL] 3)  TLB-CBC Platelet - w/Differential [85025-CBCD] 4)  TLB-Hepatic/Liver Function Pnl [80076-HEPATIC] 5)  TLB-TSH (Thyroid Stimulating Hormone) [84443-TSH] 6)  TLB-A1C / Hgb A1C (Glycohemoglobin) [83036-A1C] 7)  TLB-IBC Pnl (Iron/FE;Transferrin) [83550-IBC] 8)  TLB-B12, Serum-Total ONLY [82607-B12] 9)  TLB-Microalbumin/Creat Ratio, Urine [82043-MALB] 10)  TLB-PSA (Prostate Specific Antigen) [84153-PSA] 11)  TLB-Udip w/ Micro [81001-URINE] 12)  EKG w/ Interpretation [93000] 13)  Medicare -1st Annual Wellness Visit [G0438]

## 2010-04-12 NOTE — Assessment & Plan Note (Signed)
Summary: ABD PAIN/ FOR A WEEK /NWS   Vital Signs:  Patient profile:   77 year old male Height:      69 inches (175.26 cm) Weight:      162.56 pounds (73.89 kg) BMI:     24.09 O2 Sat:      97 % on Room air Temp:     99.0 degrees F (37.22 degrees C) oral Pulse rate:   80 / minute Pulse rhythm:   regular BP sitting:   142 / 86  (left arm) Cuff size:   regular  Vitals Entered By: Brenton Grills CMA Duncan Dull) (April 04, 2010 9:07 AM)  O2 Flow:  Room air CC: Pt c/o abdominal pain x 1 week/swelling in lower left leg/aj Is Patient Diabetic? Yes   Primary Provider:  Minus Breeding MD  CC:  Pt c/o abdominal pain x 1 week/swelling in lower left leg/aj.  History of Present Illness: no improvement in the abdominal pain, severe x 12 hrs.  he did not take the miralax.  he had a tiny amount of assoc brbpr.  it is generalized, but mostly across the lower abdomen, bilaterally.   pt states he takes flomax.  Current Medications (verified): 1)  Fish Oil 1200 Mg  Caps (Omega-3 Fatty Acids) .... Take 1 By Mouth Bid 2)  Actos 30 Mg  Tabs (Pioglitazone Hcl) .... Take 1 By Mouth Qd 3)  Lisinopril-Hydrochlorothiazide 20-12.5 Mg  Tabs (Lisinopril-Hydrochlorothiazide) .... Take 1 By Mouth Qd 4)  Flomax 0.4 Mg  Cp24 (Tamsulosin Hcl) .... Take 1 By Mouth Bid 5)  Topiramate 25 Mg Tabs (Topiramate) .... 4 Tabs Twice Daily 6)  Metoprolol Succinate 100 Mg Xr24h-Tab (Metoprolol Succinate) .... 1/2 Qd 7)  Celebrex 200 Mg Caps (Celecoxib) .[name] Kitchen.. 1 Qd 8)  Amlodipine Besylate 5 Mg Tabs (Amlodipine Besylate) .[name] Kitchen.. 1 Qd 9)  Aspirin 81 Mg  Tabs (Aspirin) .[name] Kitchen.. 1 By Mouth Daily 10)  Tramadol Hcl 50 Mg Tabs (Tramadol Hcl) .[name] Kitchen.. 1 Every 4 Hrn As Needed For Pain 11)  Vytorin 10-20 Mg Tabs (Ezetimibe-Simvastatin) .[name] Kitchen.. 1 Once Daily  Allergies (verified): 1)  ! Septra 2)  ! Niacin  Past History:  Past Medical History: Last updated: 03/21/2010 HYPERTENSION (ICD-401.9) HYPERLIPIDEMIA (ICD-272.4) GERD  (ICD-530.81) DIABETES MELLITUS, TYPE II (ICD-250.00) CARCINOMA, SKIN, SQUAMOUS CELL (ICD-173.9) INGUINAL HERNIA, RIGHT (ICD-550.90) COUGH (ICD-786.2) ROUTINE GENERAL MEDICAL EXAM@HEALTH  CARE FACL (ICD-V70.0) IMPACTED CERUMEN (ICD-380.4) OSTEOARTHRITIS (ICD-715.90) PSA, INCREASED (ICD-790.93) VITILIGO (ICD-709.01) TREMOR (ICD-781.0) ENCOUNTER FOR LONG-TERM USE OF OTHER MEDICATIONS (ICD-V58.69) DIVERTICULITIS, HX OF (ICD-V12.79) ANEMIA-NOS (ICD-285.9) ALLERGIC RHINITIS (ICD-477.9)  optometry: dr Hyacinth Meeker  Review of Systems       he has very little urine output.  he had 1 episode of n/v  Physical Exam  Abdomen:  large midline lower abd mass c/w distended bladder. Msk:  gait is normal and steady Neurologic:  sensation is intact to touch on the legs and feet.   Impression & Recommendations:  Problem # 1:  URINARY RETENTION (ICD-788.20) Assessment New  Problem # 2:  ABDOMINAL PAIN (ICD-789.00) ? due to #1  Problem # 3:  rectal bleeding.  Medications Added to Medication List This Visit: 1)  Tamsulosin Hcl 0.4 Mg Caps (Tamsulosin hcl) .[name] Kitchen.. 1 tab once daily  Other Orders: TLB-CBC Platelet - w/Differential (85025-CBCD) TLB-Hepatic/Liver Function Pnl (80076-HEPATIC) TLB-Amylase (82150-AMYL) TLB-Udip w/ Micro (81001-URINE) Gastroenterology Referral (GI) Est. Patient Level IV (04540)   Patient Instructions: 1)  refer to urology.  i have called there, and you should go there now.  2)  refer to gastroenterology.  you will be called with a day and time for an appointment.   Orders Added: 1)  TLB-CBC Platelet - w/Differential [85025-CBCD] 2)  TLB-Hepatic/Liver Function Pnl [80076-HEPATIC] 3)  TLB-Amylase [82150-AMYL] 4)  TLB-Udip w/ Micro [81001-URINE] 5)  Gastroenterology Referral [GI] 6)  Est. Patient Level IV [81191]

## 2010-04-12 NOTE — Assessment & Plan Note (Signed)
Summary: post [facility] 2 day f/u / cd   Vital Signs:  Patient profile:   77 year old male Height:      69 inches (175.26 cm) Weight:      159.75 pounds (72.61 kg) BMI:     23.68 O2 Sat:      97 % on Room air Temp:     98.9 degrees F (37.17 degrees C) oral Pulse rate:   78 / minute Pulse rhythm:   regular BP sitting:   142 / 78  (left arm) Cuff size:   regular  Vitals Entered By: Brenton Grills CMA Duncan Dull) (March 29, 2010 3:13 PM)  O2 Flow:  Room air CC: Post ER F/U for lower abd pain/aj Is Patient Diabetic? Yes   Primary Provider:  Minus Breeding MD  CC:  Post ER F/U for lower abd pain/aj.  History of Present Illness: pt says abdominal pain id "75% better," since he was seen in er 3 days ago.    Current Medications (verified): 1)  Fish Oil 1200 Mg  Caps (Omega-3 Fatty Acids) .... Take 1 By Mouth Bid 2)  Actos 30 Mg  Tabs (Pioglitazone Hcl) .... Take 1 By Mouth Qd 3)  Lisinopril-Hydrochlorothiazide 20-12.5 Mg  Tabs (Lisinopril-Hydrochlorothiazide) .... Take 1 By Mouth Qd 4)  Flomax 0.4 Mg  Cp24 (Tamsulosin Hcl) .... Take 1 By Mouth Bid 5)  Topiramate 25 Mg Tabs (Topiramate) .... 4 Tabs Twice Daily 6)  Metoprolol Succinate 100 Mg Xr24h-Tab (Metoprolol Succinate) .... 1/2 Qd 7)  Celebrex 200 Mg Caps (Celecoxib) .[name] Kitchen.. 1 Qd 8)  Amlodipine Besylate 5 Mg Tabs (Amlodipine Besylate) .[name] Kitchen.. 1 Qd 9)  Aspirin 81 Mg  Tabs (Aspirin) .[name] Kitchen.. 1 By Mouth Daily 10)  Tramadol Hcl 50 Mg Tabs (Tramadol Hcl) .[name] Kitchen.. 1 Every 4 Hrn As Needed For Pain 11)  Vytorin 10-20 Mg Tabs (Ezetimibe-Simvastatin) .[name] Kitchen.. 1 Once Daily  Allergies (verified): 1)  ! Septra 2)  ! Niacin  Past History:  Past Medical History: Last updated: 03/21/2010 HYPERTENSION (ICD-401.9) HYPERLIPIDEMIA (ICD-272.4) GERD (ICD-530.81) DIABETES MELLITUS, TYPE II (ICD-250.00) CARCINOMA, SKIN, SQUAMOUS CELL (ICD-173.9) INGUINAL HERNIA, RIGHT (ICD-550.90) COUGH (ICD-786.2) ROUTINE GENERAL MEDICAL EXAM@HEALTH  CARE FACL  (ICD-V70.0) IMPACTED CERUMEN (ICD-380.4) OSTEOARTHRITIS (ICD-715.90) PSA, INCREASED (ICD-790.93) VITILIGO (ICD-709.01) TREMOR (ICD-781.0) ENCOUNTER FOR LONG-TERM USE OF OTHER MEDICATIONS (ICD-V58.69) DIVERTICULITIS, HX OF (ICD-V12.79) ANEMIA-NOS (ICD-285.9) ALLERGIC RHINITIS (ICD-477.9)  optometry: dr Hyacinth Meeker  Review of Systems  The patient denies hematochezia.         no diarrhea.  Physical Exam  Abdomen:  abdomen is soft, nontender.  no hepatosplenomegaly.   not distended.  no hernia    Impression & Recommendations:  Problem # 1:  ABDOMINAL PAIN (ICD-789.00) Assessment Improved but not resolved  Other Orders: Est. Patient Level III (16967)  Patient Instructions: 1)  try taking "miralax," 17 grams two times a day 2)  the lab will do the blood tests you had drawen last week.  please call [phone] to hear your test results. 3)  call next week if symptoms persist.     Orders Added: 1)  Est. Patient Level III [[address]]

## 2010-04-12 NOTE — Letter (Signed)
Summary: New Patient letter  [facility]  [address] [place], [place] [address]   Phone: [phone]  Fax: [phone]       04/04/2010 MRN: [id number]  [facility] [address] [place], [place]  [address]  [place]  Dear Alex Robinson,  Welcome to the Gastroenterology Division at [facility].    You are scheduled to see Dr.  Leone Payor on 05-10-10 at 8:45A.M. on the 3rd floor at [facility], [address].  We ask that you try to arrive at our office 15 minutes prior to your appointment time to allow for check-in.  We would like you to complete the enclosed self-administered evaluation form prior to your visit and bring it with you on the day of your appointment.  We will review it with you.  Also, please bring a complete list of all your medications or, if you prefer, bring the medication bottles and we will list them.  Please bring your insurance card so that we may make a copy of it.  If your insurance requires a referral to see a specialist, please bring your referral form from your primary care physician.  Co-payments are due at the time of your visit and may be paid by cash, check or credit card.     Your office visit will consist of a consult with your physician (includes a physical exam), any laboratory testing he/she may order, scheduling of any necessary diagnostic testing (e.g. x-ray, ultrasound, CT-scan), and scheduling of a procedure (e.g. Endoscopy, Colonoscopy) if required.  Please allow enough time on your schedule to allow for any/all of these possibilities.    If you cannot keep your appointment, please call [phone] to cancel or reschedule prior to your appointment date.  This allows [place] the opportunity to schedule an appointment for another patient in need of care.  If you do not cancel or reschedule by 5 p.m. the business day prior to your appointment date, you will be charged a $50.00 late cancellation/no-show fee.    Thank you for choosing  [facility] for your medical needs.  We appreciate the opportunity to care for you.  Please visit [place] at our website  to learn more about our practice.                     Sincerely,                                                             The Gastroenterology Division

## 2010-04-18 NOTE — Consult Note (Signed)
Summary: [facility]   Imported By: Sherian Rein 04/10/2010 10:26:16  _____________________________________________________________________  External Attachment:    Type:   Image     Comment:   External Document

## 2010-04-19 ENCOUNTER — Encounter: Payer: Self-pay | Admitting: Endocrinology

## 2010-04-19 ENCOUNTER — Ambulatory Visit (INDEPENDENT_AMBULATORY_CARE_PROVIDER_SITE_OTHER): Payer: Medicare Other | Admitting: Endocrinology

## 2010-04-19 DIAGNOSIS — J069 Acute upper respiratory infection, unspecified: Secondary | ICD-10-CM

## 2010-04-21 ENCOUNTER — Emergency Department (HOSPITAL_COMMUNITY)
Admission: EM | Admit: 2010-04-21 | Discharge: 2010-04-21 | Disposition: A | Discharge status: Home / Self Care | Payer: Medicare Other | Attending: Emergency Medicine | Admitting: Emergency Medicine

## 2010-04-21 DIAGNOSIS — I10 Essential (primary) hypertension: Secondary | ICD-10-CM | POA: Insufficient documentation

## 2010-04-21 DIAGNOSIS — E119 Type 2 diabetes mellitus without complications: Secondary | ICD-10-CM | POA: Insufficient documentation

## 2010-04-21 DIAGNOSIS — T83091A Other mechanical complication of indwelling urethral catheter, initial encounter: Secondary | ICD-10-CM | POA: Insufficient documentation

## 2010-04-21 DIAGNOSIS — R259 Unspecified abnormal involuntary movements: Secondary | ICD-10-CM | POA: Insufficient documentation

## 2010-04-21 DIAGNOSIS — Y846 Urinary catheterization as the cause of abnormal reaction of the patient, or of later complication, without mention of misadventure at the time of the procedure: Secondary | ICD-10-CM | POA: Insufficient documentation

## 2010-04-24 NOTE — Assessment & Plan Note (Signed)
Summary: congestion/lb   Vital Signs:  Patient profile:   77 year old male Height:      69 inches (175.26 cm) Weight:      155.50 pounds (70.68 kg) BMI:     23.05 O2 Sat:      95 % on Room air Temp:     99.3 degrees F (37.39 degrees C) oral Pulse rate:   76 / minute Pulse rhythm:   regular BP sitting:   124 / 68  (left arm) Cuff size:   regular  Vitals Entered By: Brenton Grills CMA Duncan Dull) (April 19, 2010 2:04 PM)  O2 Flow:  Room air CC: Chest and head congestion, cough/aj Is Patient Diabetic? Yes   Primary Provider:  Minus Breeding MD  CC:  Chest and head congestion and cough/aj.  History of Present Illness: pt states 1 week of nasal congestion, prod cough, and slight bilat earache.    Current Medications (verified): 1)  Fish Oil 1200 Mg  Caps (Omega-3 Fatty Acids) .... Take 1 By Mouth Bid 2)  Actos 30 Mg  Tabs (Pioglitazone Hcl) .... Take 1 By Mouth Qd 3)  Lisinopril-Hydrochlorothiazide 20-12.5 Mg  Tabs (Lisinopril-Hydrochlorothiazide) .... Take 1 By Mouth Qd 4)  Flomax 0.4 Mg  Cp24 (Tamsulosin Hcl) .... Take 1 By Mouth Bid 5)  Topiramate 25 Mg Tabs (Topiramate) .... 4 Tabs Twice Daily 6)  Metoprolol Succinate 100 Mg Xr24h-Tab (Metoprolol Succinate) .... 1/2 Qd 7)  Celebrex 200 Mg Caps (Celecoxib) .[name] Kitchen.. 1 Qd 8)  Amlodipine Besylate 5 Mg Tabs (Amlodipine Besylate) .[name] Kitchen.. 1 Qd 9)  Aspirin 81 Mg  Tabs (Aspirin) .[name] Kitchen.. 1 By Mouth Daily 10)  Tramadol Hcl 50 Mg Tabs (Tramadol Hcl) .[name] Kitchen.. 1 Every 4 Hrn As Needed For Pain 11)  Vytorin 10-20 Mg Tabs (Ezetimibe-Simvastatin) .[name] Kitchen.. 1 Once Daily 12)  Tamsulosin Hcl 0.4 Mg Caps (Tamsulosin Hcl) .[name] Kitchen.. 1 Tab Once Daily  Allergies (verified): 1)  ! Septra 2)  ! Niacin  Past History:  Past Medical History: Last updated: 03/21/2010 HYPERTENSION (ICD-401.9) HYPERLIPIDEMIA (ICD-272.4) GERD (ICD-530.81) DIABETES MELLITUS, TYPE II (ICD-250.00) CARCINOMA, SKIN, SQUAMOUS CELL (ICD-173.9) INGUINAL HERNIA, RIGHT (ICD-550.90) COUGH  (ICD-786.2) ROUTINE GENERAL MEDICAL EXAM@HEALTH  CARE FACL (ICD-V70.0) IMPACTED CERUMEN (ICD-380.4) OSTEOARTHRITIS (ICD-715.90) PSA, INCREASED (ICD-790.93) VITILIGO (ICD-709.01) TREMOR (ICD-781.0) ENCOUNTER FOR LONG-TERM USE OF OTHER MEDICATIONS (ICD-V58.69) DIVERTICULITIS, HX OF (ICD-V12.79) ANEMIA-NOS (ICD-285.9) ALLERGIC RHINITIS (ICD-477.9)  optometry: dr Hyacinth Meeker  Review of Systems  The patient denies fever.    Physical Exam  General:  elderly, frail, no distress  Head:  head: no deformity eyes: no periorbital swelling, no proptosis external nose and ears are normal mouth: no lesion seen Ears:  left tm is occluded with cerumen.  right is normal Lungs:  Clear to auscultation bilaterally. Normal respiratory effort.  Additional Exam:  intervention: left eac is irrigated repeat exam is normal   Impression & Recommendations:  Problem # 1:  URI (ICD-465.9) Assessment Improved but he has persistent cough  Medications Added to Medication List This Visit: 1)  Benzonatate 100 Mg Caps (Benzonatate) .[name] Kitchen.. 1 tab three times a day as needed for cough  Other Orders: Est. Patient Level III (09811)  Patient Instructions: 1)  take benzonatate 100 mg three times a day as needed for cough 2)  call next week if you are not feeling better. Prescriptions: BENZONATATE 100 MG CAPS (BENZONATATE) 1 tab three times a day as needed for cough  #30 x 0   Entered and Authorized by:   Minus Breeding MD  Signed by:   Minus Breeding MD on 04/19/2010   Method used:   Electronically to        [facility] [phone]* (retail)       [address] [place], [place]  [address]       Ph: [phone]       Fax: [phone]   RxID:   [id number]    Orders Added: 1)  Est. Patient Level III [24401]

## 2010-05-04 ENCOUNTER — Encounter (INDEPENDENT_AMBULATORY_CARE_PROVIDER_SITE_OTHER): Payer: Self-pay | Admitting: *Deleted

## 2010-05-08 ENCOUNTER — Encounter: Payer: Self-pay | Admitting: Endocrinology

## 2010-05-08 NOTE — Letter (Signed)
Summary: New Patient letter  [facility]  [address] [place], [place] [address]   Phone: [phone]  Fax: [phone]       05/04/2010 MRN: [id number]  [facility] [address] [place], [place]  [address]  [place]  Dear Alex Robinson,  Welcome to the Gastroenterology Division at [facility].    You are scheduled to see Dr.  Arlyce Dice on 06-14-10 at 11:00A.M. on the 3rd floor at [facility], [address].  We ask that you try to arrive at our office 15 minutes prior to your appointment time to allow for check-in.  We would like you to complete the enclosed self-administered evaluation form prior to your visit and bring it with you on the day of your appointment.  We will review it with you.  Also, please bring a complete list of all your medications or, if you prefer, bring the medication bottles and we will list them.  Please bring your insurance card so that we may make a copy of it.  If your insurance requires a referral to see a specialist, please bring your referral form from your primary care physician.  Co-payments are due at the time of your visit and may be paid by cash, check or credit card.     Your office visit will consist of a consult with your physician (includes a physical exam), any laboratory testing he/she may order, scheduling of any necessary diagnostic testing (e.g. x-ray, ultrasound, CT-scan), and scheduling of a procedure (e.g. Endoscopy, Colonoscopy) if required.  Please allow enough time on your schedule to allow for any/all of these possibilities.    If you cannot keep your appointment, please call [phone] to cancel or reschedule prior to your appointment date.  This allows [place] the opportunity to schedule an appointment for another patient in need of care.  If you do not cancel or reschedule by 5 p.m. the business day prior to your appointment date, you will be charged a $50.00 late cancellation/no-show fee.    Thank you for choosing   Gastroenterology for your medical needs.  We appreciate the opportunity to care for you.  Please visit [place] at our website  to learn more about our practice.                     Sincerely,                                                             The Gastroenterology Division

## 2010-05-10 ENCOUNTER — Ambulatory Visit: Payer: Private Health Insurance - Indemnity | Admitting: Internal Medicine

## 2010-05-15 ENCOUNTER — Other Ambulatory Visit: Payer: Self-pay | Admitting: Urology

## 2010-05-15 ENCOUNTER — Encounter (HOSPITAL_COMMUNITY): Payer: Medicare Other

## 2010-05-15 LAB — BASIC METABOLIC PANEL
BUN: 24 mg/dL — ABNORMAL HIGH (ref 6–23)
CO2: 29 mEq/L (ref 19–32)
Calcium: 8.9 mg/dL (ref 8.4–10.5)
Creatinine, Ser: 1.17 mg/dL (ref 0.4–1.5)
Glucose, Bld: 95 mg/dL (ref 70–99)

## 2010-05-15 LAB — CBC
HCT: 37.5 % — ABNORMAL LOW (ref 39.0–52.0)
Hemoglobin: 12 g/dL — ABNORMAL LOW (ref 13.0–17.0)
MCH: 30.5 pg (ref 26.0–34.0)
MCHC: 32 g/dL (ref 30.0–36.0)
RDW: 13.6 % (ref 11.5–15.5)

## 2010-05-15 NOTE — Letter (Signed)
[facility]: [facility]   Imported By: Sherian Rein 05/10/2010 12:20:59  _____________________________________________________________________  External Attachment:    Type:   Image     Comment:   External Document

## 2010-05-21 ENCOUNTER — Inpatient Hospital Stay (HOSPITAL_COMMUNITY)
Admission: RE | Admit: 2010-05-21 | Discharge: 2010-05-31 | DRG: 714 | Disposition: A | Discharge status: Home / Self Care | Payer: Medicare Other | Source: Ambulatory Visit | Attending: Urology | Admitting: Urology

## 2010-05-21 DIAGNOSIS — K219 Gastro-esophageal reflux disease without esophagitis: Secondary | ICD-10-CM | POA: Diagnosis present

## 2010-05-21 DIAGNOSIS — I10 Essential (primary) hypertension: Secondary | ICD-10-CM | POA: Diagnosis present

## 2010-05-21 DIAGNOSIS — Z01812 Encounter for preprocedural laboratory examination: Secondary | ICD-10-CM

## 2010-05-21 DIAGNOSIS — G250 Essential tremor: Secondary | ICD-10-CM | POA: Diagnosis present

## 2010-05-21 DIAGNOSIS — N401 Enlarged prostate with lower urinary tract symptoms: Principal | ICD-10-CM | POA: Diagnosis present

## 2010-05-21 DIAGNOSIS — E119 Type 2 diabetes mellitus without complications: Secondary | ICD-10-CM | POA: Diagnosis present

## 2010-05-21 DIAGNOSIS — R339 Retention of urine, unspecified: Secondary | ICD-10-CM | POA: Diagnosis present

## 2010-05-21 DIAGNOSIS — E7800 Pure hypercholesterolemia, unspecified: Secondary | ICD-10-CM | POA: Diagnosis present

## 2010-05-21 DIAGNOSIS — N138 Other obstructive and reflux uropathy: Principal | ICD-10-CM | POA: Diagnosis present

## 2010-05-21 DIAGNOSIS — R972 Elevated prostate specific antigen [PSA]: Secondary | ICD-10-CM | POA: Diagnosis present

## 2010-05-21 DIAGNOSIS — Z79899 Other long term (current) drug therapy: Secondary | ICD-10-CM

## 2010-05-22 ENCOUNTER — Ambulatory Visit: Payer: Private Health Insurance - Indemnity | Admitting: Internal Medicine

## 2010-05-23 ENCOUNTER — Inpatient Hospital Stay (HOSPITAL_COMMUNITY): Payer: Medicare Other

## 2010-05-28 ENCOUNTER — Ambulatory Visit (HOSPITAL_COMMUNITY)
Admission: RE | Admit: 2010-05-28 | Discharge: 2010-05-28 | Disposition: A | Discharge status: Home / Self Care | Payer: Medicare Other | Source: Ambulatory Visit | Attending: Urology | Admitting: Urology

## 2010-05-28 ENCOUNTER — Other Ambulatory Visit: Payer: Self-pay | Admitting: Urology

## 2010-05-28 DIAGNOSIS — E119 Type 2 diabetes mellitus without complications: Secondary | ICD-10-CM | POA: Insufficient documentation

## 2010-05-28 DIAGNOSIS — E7800 Pure hypercholesterolemia, unspecified: Secondary | ICD-10-CM | POA: Insufficient documentation

## 2010-05-28 DIAGNOSIS — Z79899 Other long term (current) drug therapy: Secondary | ICD-10-CM | POA: Insufficient documentation

## 2010-05-28 DIAGNOSIS — N401 Enlarged prostate with lower urinary tract symptoms: Secondary | ICD-10-CM | POA: Insufficient documentation

## 2010-05-28 DIAGNOSIS — I10 Essential (primary) hypertension: Secondary | ICD-10-CM | POA: Insufficient documentation

## 2010-05-28 DIAGNOSIS — N138 Other obstructive and reflux uropathy: Secondary | ICD-10-CM | POA: Insufficient documentation

## 2010-05-28 LAB — GLUCOSE, CAPILLARY

## 2010-05-29 LAB — CBC
HCT: 40.6 % (ref 39.0–52.0)
Hemoglobin: 13.6 g/dL (ref 13.0–17.0)
Platelets: 195 10*3/uL (ref 150–400)
RBC: 4.22 MIL/uL (ref 4.22–5.81)
WBC: 6.1 10*3/uL (ref 4.0–10.5)

## 2010-05-29 LAB — COMPREHENSIVE METABOLIC PANEL
Albumin: 3.9 g/dL (ref 3.5–5.2)
Alkaline Phosphatase: 55 U/L (ref 39–117)
BUN: 28 mg/dL — ABNORMAL HIGH (ref 6–23)
CO2: 25 mEq/L (ref 19–32)
Chloride: 110 mEq/L (ref 96–112)
Glucose, Bld: 95 mg/dL (ref 70–99)
Potassium: 4.3 mEq/L (ref 3.5–5.1)
Total Bilirubin: 0.5 mg/dL (ref 0.3–1.2)

## 2010-05-29 LAB — DIFFERENTIAL
Basophils Absolute: 0 10*3/uL (ref 0.0–0.1)
Basophils Relative: 1 % (ref 0–1)
Monocytes Absolute: 0.5 10*3/uL (ref 0.1–1.0)
Neutro Abs: 4 10*3/uL (ref 1.7–7.7)

## 2010-05-29 LAB — GLUCOSE, CAPILLARY: Glucose-Capillary: 97 mg/dL (ref 70–99)

## 2010-05-31 LAB — GLUCOSE, CAPILLARY

## 2010-06-07 ENCOUNTER — Ambulatory Visit: Payer: Medicare Other | Admitting: Internal Medicine

## 2010-06-10 ENCOUNTER — Other Ambulatory Visit: Payer: Self-pay | Admitting: Endocrinology

## 2010-06-10 MED ORDER — ATORVASTATIN CALCIUM 80 MG PO TABS: 80.0000 mg | ORAL_TABLET | Freq: Every day | ORAL | Status: DC

## 2010-06-12 ENCOUNTER — Other Ambulatory Visit: Payer: Self-pay | Admitting: Endocrinology

## 2010-06-12 NOTE — Op Note (Signed)
NAMEPATRIK, Alex Robinson                  ACCOUNT NO.:  [id number]  MEDICAL RECORD NO.:  [id number]           PATIENT TYPE:  I  LOCATION:  X002                         FACILITY:  [facility]  PHYSICIAN:  Alex Robinson, RobinsonDATE OF BIRTH:  [birth year]  DATE OF PROCEDURE:  05/28/2010 DATE OF DISCHARGE:                              OPERATIVE REPORT   PREOPERATIVE DIAGNOSIS:  Benign prostatic hypertrophy with retention.  POSTOPERATIVE DIAGNOSES:  Benign prostatic hypertrophy with retention.  PROCEDURE:  Transurethral resection of the prostate.  SURGEON:  Alex Robinson, M.D.  ANESTHESIA:  General with LMA.  COMPLICATIONS:  None.  SPECIMEN:  Prostate chips to pathology.  BRIEF HISTORY:  This 77 year old male has urinary retention.  He has a large prostate and urodynamic evaluation revealed the patient to have a functioning bladder with obstruction and moderate bladder pressures.  As he has failed voiding trials, he presents at this time for TURP.  Risks and complications of the procedure have been discussed with the patient. He understands the procedure, risks and complications and desires to proceed.  DESCRIPTION OF PROCEDURE:  The patient was identified in the holding area.  He received preoperative IV antibiotics.  He was taken to the operating room where general anesthetic was administered using the LMA. He was placed in the dorsal lithotomy position.  Genitalia and perineum were prepped and draped.  Time-out was then called.  The procedure then commenced.  A 22-French panendoscope was advanced under direct vision through his urethra.  Prostate was not obstructive and urethral length within the prostate was approximately 4.5 cm.  The bladder was inspected circumferentially.  There were no specific tumors. There were some reactive changes with the catheter being present.  There were trabeculations noted as well.  Both ureteral orifices were normal in configuration and  location and were fairly close to the bladder neck. Following full evaluation of bladder with the cystoscope, the resectoscope was then placed transurethrally.  The resectoscope element cutting loop were then placed.  The TURP began.  I resected the prostate from the bladder neck to the verumontanum, first in the midline down to surgical capsule.  I then resected the prostate from the bladder neck to the distal prostatic urethra in the midline at the 12 o'clock.  This opened up the prostatic fossa and enabled the rest of the resection. The resection then commenced on the right prostatic lobe and then the left prostatic lobe, using the verumontanum as landmark for distal resection.  Resection was carried down to the surgical capsule circumferentially.  There was a capsular perforation on the right which was controlled with electrocautery.  I then irrigated the prostatic chips from the bladder with a Microvasive evacuator.  The bladder was inspected.  No chips were seen.  Careful coagulation was performed of the resected prostatic fossa and Robinson bleeding was stopped except for the capsular perforation which I would be well controlled with some mild traction.  I then removed the resectoscope and placed a 22-French Foley catheter, three-way, with 45 cc of water placed in the balloon.  The catheter was placed on gentle traction and  constant continuous bladder irrigation.  The patient tolerated the procedure well.  He was awakened and taken to the PACU in stable condition.  His prostatic chips were sent for permanent specimen to pathology.     Alex Robinson, M.D.     SMD/MEDQ  D:  05/28/2010  T:  05/28/2010  Job:  [id number]  cc:   Alex Signs A. Everardo All, MD [address] [place] [address]  Electronically Signed by Alex Robinson M.D. on 06/12/2010 01:19:43 PM

## 2010-06-12 NOTE — Telephone Encounter (Signed)
Received fax from Pharmacy that insurance no longer covers Vytorin. Rx sent for [facility] to [facility]. (on formulary) Left message for pt to callback office to inform.

## 2010-06-13 NOTE — Telephone Encounter (Signed)
Left message for pt to callback office.  

## 2010-06-14 ENCOUNTER — Ambulatory Visit: Payer: Private Health Insurance - Indemnity | Admitting: Gastroenterology

## 2010-06-14 NOTE — Telephone Encounter (Signed)
Pt informed that new rx (Lipitor) was sent to [facility].

## 2010-06-22 ENCOUNTER — Emergency Department (HOSPITAL_COMMUNITY)
Admission: EM | Admit: 2010-06-22 | Discharge: 2010-06-22 | Disposition: A | Discharge status: Home / Self Care | Payer: Medicare Other | Attending: Emergency Medicine | Admitting: Emergency Medicine

## 2010-06-22 DIAGNOSIS — E119 Type 2 diabetes mellitus without complications: Secondary | ICD-10-CM | POA: Insufficient documentation

## 2010-06-22 DIAGNOSIS — I10 Essential (primary) hypertension: Secondary | ICD-10-CM | POA: Insufficient documentation

## 2010-06-22 DIAGNOSIS — R319 Hematuria, unspecified: Secondary | ICD-10-CM | POA: Insufficient documentation

## 2010-06-22 DIAGNOSIS — N390 Urinary tract infection, site not specified: Secondary | ICD-10-CM | POA: Insufficient documentation

## 2010-06-22 LAB — URINALYSIS, ROUTINE W REFLEX MICROSCOPIC
Nitrite: POSITIVE — AB
Specific Gravity, Urine: 1.025 (ref 1.005–1.030)
Urobilinogen, UA: 1 mg/dL (ref 0.0–1.0)

## 2010-06-22 LAB — URINE MICROSCOPIC-ADD ON

## 2010-06-23 LAB — URINE CULTURE
Colony Count: 65000
Culture  Setup Time: 201204271522

## 2010-06-30 ENCOUNTER — Other Ambulatory Visit: Payer: Self-pay | Admitting: Endocrinology

## 2010-07-09 ENCOUNTER — Other Ambulatory Visit: Payer: Self-pay | Admitting: *Deleted

## 2010-07-09 MED ORDER — ATORVASTATIN CALCIUM 80 MG PO TABS: 80.0000 mg | ORAL_TABLET | Freq: Every day | ORAL | Status: DC

## 2010-07-09 NOTE — Telephone Encounter (Signed)
[facility] sent fax stating that pt is requesting a 90 day supply of Lipitor

## 2010-07-10 NOTE — Assessment & Plan Note (Signed)
[facility]                            CARDIOLOGY OFFICE NOTE   Alex Robinson, Alex Robinson                         MRN:          [id number]  DATE:12/31/2007                            DOB:          21-Feb-1926    PRIMARY CARE PHYSICIAN:  Sean A. Everardo All, MD   REASON FOR PRESENTATION:  Evaluate the patient with abnormal Cardiolite  and hypertension.   HISTORY OF PRESENT ILLNESS:  The patient is here to see me for the first  time.  He was previously seen by Dr. Diona Browner.  I did care for his wife  for years.  She passed away earlier this year.  He is a pleasant 77-year-  old gentleman.  He did have stress perfusion study, which was abnormal  first in 2000 and most recently in 2006 with inferior ischemia versus  attenuation.  His EF is well preserved.  He has been managed  conservatively with risk reduction.  Since his wife's death, he has been  quite lonely.  He does get out and ride his bike and actually did this  for 10 miles the other day.  He is not having any chest pressure, neck  or arm discomfort.  He is not having any shortness of breath, PND, or  orthopnea.  He has had no palpitations, presyncope, or syncope.  He is  getting some progressive joint pains related to arthritis and also a  worsening essential tremor.   PAST MEDICAL HISTORY:  1. Essential tremor.  2. Arthritis.  3. Tonsillectomy.  4. Appendectomy.  5. Ear surgery.   ALLERGIES AND INTOLERANCES:  SEPTRA and NIACIN.   MEDICATIONS:  1. Fish oil.  2. Celebrex 200 mg daily.  3. Actos 30 mg daily.  4. Lisinopril/hydrochlorothiazide 20/12.5 daily.  5. Flomax 0.4 mg b.i.d.  6. Vytorin 10/80 daily.  7. Aspirin 81 mg daily.  8. Nifedipine 30 mg daily.  9. Toprol-XL 50 mg daily.  10.Primidone 150 mg b.i.d.   REVIEW OF SYSTEMS:  As stated in the HPI and otherwise negative for  other systems.   PHYSICAL EXAMINATION:  GENERAL:  The patient is pleasant and in no  distress.  VITAL SIGNS:   Blood pressure 167/79, heart rate 55 and regular, and  weight 174 pounds.  HEENT:  Eyelids unremarkable; pupils equal, round, and reactive to  light; fundi not visualized, oral mucosa unremarkable.  NECK:  No jugular venous distention at 45 degrees; carotid upstroke  brisk and symmetric; no bruits, no thyromegaly.  LYMPHATICS:  No cervical, axillary, or inguinal adenopathy.  LUNGS:  Clear to auscultation bilaterally.  BACK:  No costovertebral angle tenderness.  CHEST:  Unremarkable.  HEART:  PMI not displaced or sustained; S1 and S2 within normal limits;  no S3, no S4; no clicks, no rubs, no murmurs.  ABDOMEN:  Mildly obese; positive bowel sounds, normal in frequency and  pitch; no bruits, no rebound, no guarding; no midline pulsatile mass; no  hepatomegaly, no splenomegaly.  SKIN:  No rashes, no nodules.  EXTREMITIES:  2+ pulses; arthritic joint changes.  NEURO:  Resting tremor; cranial nerves  II through XII are grossly  intact; motor grossly intact.   EKG sinus rhythm, rate 56, right bundle-branch block, left anterior-  fascicular block, and no changes from previous EKGs.   ASSESSMENT AND PLAN:  1. Abnormal Cardiolite.  The patient is having no symptoms related to      this.  He biked 10 miles the other day at age 77!  I think this      indicates that no further testing is needed.  He will continue with      primary risk reduction.  2. Hypertension.  I am concerned that two readings in a row      demonstrated his blood pressure to be high.  He promises he will go      up and try a blood pressure cuff.  He will keep at it home.  He      will then present to either Dr. Kriste Basque or myself with blood pressure      diary in 6-8 weeks.  For now, he will remain on the meds as listed.  3. Dyslipidemia per Dr. Kriste Basque with a goal LDL less than 100 and HDL      greater than 40, I think, is an aggressive therapy.   FOLLOWUP:  I will see the patient again in 1 year or sooner if he needs  to  have further treatment of his blood pressure based on the diary.     Rollene Rotunda, MD, [facility]  Electronically Signed    JH/MedQ  DD: 12/31/2007  DT: 01/01/2008  Job #: [id number]   cc:   Lonzo Cloud. Kriste Basque, MD

## 2010-07-10 NOTE — Assessment & Plan Note (Signed)
[facility]                            CARDIOLOGY OFFICE NOTE   AMIEL, MCCAFFREY                         MRN:          [id number]  DATE:07/22/2007                            DOB:          [birth year]    PRIMARY CARE PHYSICIAN:  Dr. Romero Belling.   REASON FOR VISIT:  Routine followup.   HISTORY OF PRESENT ILLNESS:  Mr. Cates returns for a 6-month visit.  His  wife recently passed away on May 19, 2024Oswaldo Done, previously followed by  Dr. Antoine Poche.  He is not reporting any angina and continues to do some  walking at his home.  Electrocardiogram is overall stable showing a  chronic left anterior fascicular block and right bundle branch block.  He has been managed medically with a previously mildly abnormal Myoview,  and presumed underlying coronary atherosclerosis.  This has been  relatively asymptomatic.   ALLERGIES:  SEPTRA, NIACIN.   MEDICATIONS:  1. Omega III supplements 1200 mg p.o. b.i.d.  2. Celebrex 200 mg p.o. daily.  3. Actos 30 mg p.o. daily.  4. Zyrtec D b.i.d.  5. Lisinopril hydrochlorothiazide 20/12.5 mg p.o. daily.  6. Flomax 0.4 mg p.o. daily.  7. Vytorin 10/80 mg p.o. daily.  8. Aspirin 81 mg p.o. daily.  9. Nifedipine ER 30 mg p.o. daily.  10.Toprol XL 50 mg p.o. daily.  11.Primidone 50 mg 3 tablets p.o. b.i.d.  12.Astelin nasal spray p.r.n.   REVIEW OF SYSTEMS:  As described in the history of present illness.  No  claudication or lower extremity edema.  No palpitations or syncope.  Otherwise negative.   EXAMINATION:  Blood pressure 167/82, heart rate is 63, weight is 183  pounds.  The patient is comfortable in no acute distress.  Examination of the neck reveals no loud carotid bruits.  No thyromegaly.  LUNGS:  Clear without labored breathing.  CARDIAC:  Exam with a very soft systolic murmur, preserved S2.  No S3  gallop or pericardial rub.  EXTREMITIES:  Exhibit no frank pitting edema.   IMPRESSION/RECOMMENDATIONS:   Presumed underlying coronary  atherosclerosis, based on previous mildly abnormal Myoview.  We have  been managing Mr. Devincent expectantly on medical therapy and he has done  quite well.  He continues to walk regularly.  We talked about reporting  any new limiting symptoms that might require further attention.  I have  encouraged him to follow up with Dr. Everardo All regarding blood pressure  and diabetes control, and will schedule a 39-month visit with Dr.  Antoine Poche for routine cardiac followup.     [name], MD  Electronically Signed    SGM/MedQ  DD: 07/22/2007  DT: 07/22/2007  Job #: [id number]   cc:   Gregary Signs A. Everardo All, MD

## 2010-07-10 NOTE — Assessment & Plan Note (Signed)
[facility]                            CARDIOLOGY OFFICE NOTE   Alex Robinson, Alex Robinson                         MRN:          [id number]  DATE:12/05/2006                            DOB:          Oct 26, 1925    PRIMARY CARE PHYSICIAN:  Sean A. Everardo All, M.D.   REASON FOR VISIT:  Cardiac followup.   HISTORY OF PRESENT ILLNESS:  Alex Robinson returns for a 78-month visit.  He  is not reporting any angina or limiting dyspnea.  He continues to either  walk a mile on his property or use a stationary bicycle fairly  regularly.  We have been managing him medically for presumed underlying  cardiovascular disease based on a mildly abnormal myocardial profusion  study.  His electrocardiogram is stable showing right bundle branch  block and left anterior fascicular block.  Medications are outlined  below.  He is not reporting any problems with palpitations, dizziness,  or syncope.   ALLERGIES:  1. SEPTRA.  2. NIACIN.   PRESENT MEDICATIONS:  1. Astelin as directed.  2. Omega-3 supplements 1200 mg p.o. b.i.d.  3. Celebrex 200 mg p.o. daily.  4. Actos 30 mg p.o. daily.  5. Lisinopril HCTZ 20/12.5 mg p.o. daily.  6. Nifedipine  30 mg p.o. daily.  7. Flomax 0.4 mg p.o. b.i.d.  8. Toprol XL 50 mg p.o. daily.  9. Vytorin 10/80 mg p.o. daily.  10.Primidone 50 mg p.o. q.a.m. and 25 mg p.o. q.p.m.  11.Aspirin 81 mg p.o. daily.   REVIEW OF SYSTEMS:  As described in the history of present illness.   PHYSICAL EXAMINATION:  VITAL SIGNS:  Blood pressure today is 126/64,  heart rate 64, weight is 182 pounds.  GENERAL:  The patient is comfortable and in no acute distress.  NECK:  Reveals no elevated jugular venous pressure .  There are no loud  bruits.  No thyromegaly is noted.  LUNGS:  Clear without labored breathing.  CARDIAC:  Reveals a regular rate and rhythm without murmur, rub, or  gallop.  ABDOMEN;  Soft, nontender.  Normoactive bowel sounds.  EXTREMITIES:  Show no  pitting edema.   IMPRESSION/RECOMMENDATIONS:  1. Presumed underlying cardiovascular disease based on previously      mildly abnormal Myoview.  The patient is not having any symptoms of      angina or breathlessness.  I have recommended that he continue a      basic exercise regimen and medical therapy.  I will see him back      over the next 6 months.  2. Otherwise continue to follow up for hyperlipidemia and type 2      diabetes mellitus with Dr. Everardo All.     [name], MD  Electronically Signed    SGM/MedQ  DD: 12/05/2006  DT: 12/06/2006  Job #: [id number]   cc:   Gregary Signs A. Everardo All, MD

## 2010-07-13 NOTE — Assessment & Plan Note (Signed)
[facility]                            CARDIOLOGY OFFICE NOTE   Alex, Robinson                         MRN:          [id number]  DATE:06/16/2006                            DOB:          February 03, 1926    PRIMARY CARE PHYSICIAN:  Sean A. Everardo All, M.D.   REASON FOR VISIT:  Routine cardiac followup.   HISTORY OF PRESENT ILLNESS:  [name] returns to the office stating  that he is doing well.  He continues to walk approximately one mile at a  time on his acre of property without any chest pain or limiting dyspnea.  His electrocardiogram is stable today, showing sinus bradycardia at 58  beats per minute with old right bundle branch block and left anterior  fascicular block pattern.  He states that he has been seeing a physician  over at [facility] regarding tremors  and has had his beta-blocker uptitrated for this.  He is now on Toprol-  XL 100 mg daily.  He seems to be tolerating this fairly well.  He denies  any problems with dizziness or syncope.   ALLERGIES:  SEPTRA AND NIACIN.   MEDICATIONS:  1. Astelin nasal spray, two sprays b.i.d.  2. Omega 3 fish oil supplements 1200 mg p.o. b.i.d.  3. Prevacid 30 mg p.o. daily.  4. Celebrex 200 mg p.o. daily.  5. Actos 30 mg p.o. daily.  6. Zyrtec-D one b.i.d.  7. Lisinopril & hydrochlorothiazide 20/12.5 mg p.o. daily.  8. Nifedipine ER 30 mg p.o. b.i.d.  9. Flomax 0.4 mg p.o. b.i.d.  10.Vytorin 10/80 mg p.o. daily.  11.Toprol-XL 100 mg p.o. daily.   REVIEW OF SYSTEMS:  As described in the history of present illness.   PHYSICAL EXAMINATION:  VITAL SIGNS:  Blood pressure 134/73, heart rate  63 and regular.  Weight is 184 pounds, up 14 pounds from his last visit.  GENERAL:  The patient is comfortable and in no acute distress.  NECK:  No elevated jugular venous pressure or loud bruits.  No  thyromegaly is noted.  LUNGS:  Clear without labored breathing at rest.  CARDIAC:  Regular rate and rhythm.  No loud murmur, S3 gallop.  EXTREMITIES:  No pitting edema.   IMPRESSION AND RECOMMENDATIONS:  1. Probable underlying coronary artery disease based on the previous      mildly abnormal myocardial perfusion study.  The patient is      symptomatically stable without angina or limiting dyspnea on the      present medical regimen, and we will continue to follow him      expectantly.  His electrocardiogram is stable.  He has had an      uptitration of his beta-blocker for treatment of tremors, and I      have asked him to be observant for any problems with dizziness,      syncope, or progressive fatigue.  He does have right bundle branch      block and left anterior fascicular block pattern on his      electrocardiogram, but this is  stable.  I will plan to see him back      in the next six months.  2. Hyperlipidemia and type 2 diabetes mellitus followed by Dr.      Everardo All.  Goal low-density lipoprotein would be around 70.     [name], MD  Electronically Signed    SGM/MedQ  DD: 06/16/2006  DT: 06/16/2006  Job #: [id number]   cc:   Gregary Signs A. Everardo All, MD

## 2010-07-13 NOTE — Assessment & Plan Note (Signed)
Alex Robinson                              CARDIOLOGY OFFICE NOTE   Alex Robinson, Alex Robinson                         MRN:          [id number]  DATE:11/28/2005                            DOB:          06-Jun-1925    PRIMARY CARE PHYSICIAN:  Dr. Gregary Signs A. Everardo All.   REASON FOR VISIT:  Routine cardiac followup.   HISTORY OF PRESENT ILLNESS:  I saw Alex Robinson back in April. He states that  he continues to do well without any significant exertional angina or  shortness of breath. His history is outlined in my previous note. His  electrocardiogram today shows sinus rhythm with right bundle branch block  and left anterior fascicular block patterns which are old. He is being  managed medically following a previously relatively low risk abnormal  Myoview study. His medications are outlined below.   ALLERGIES:  SEPTRA and NIACIN.   MEDICATIONS:  1. Astelin nasal spray 2 sprays b.i.d.  2. Fish oil 1200 mg p.o. b.i.d.  3. Prevacid 30 mg p.o. daily.  4. Celebrex 200 mg p.o. daily.  5. Actos 30 mg p.o. daily.  6. Vytorin 10/80 mg p.o. daily.  7. Flomax 0.4 mg p.o. b.i.d.  8. Nifedipine 30 mg p.o. b.i.d.  9. Lisinopril HCT 20/12.5 mg p.o. daily.  10.Zyrtec D 1 p.o. b.i.d.  11.Toprol XL 50 mg p.o. daily.   REVIEW OF SYSTEMS:  As described in the history of present illness.   PHYSICAL EXAMINATION:  VITAL SIGNS:  Blood pressure is 125/71, heart rate is  72, weight is 170 pounds which is stable.  GENERAL:  The patient is stable and in no acute distress.  NECK:  Reveals normal jugular venous pressure without bruits. No thyromegaly  is noted.  LUNGS:  Clear without labored breathing.  CARDIAC:  Reveals a regular rate and rhythm without murmurs, rubs or  gallops.  EXTREMITIES:  Show no significant pitting edema.   IMPRESSION/RECOMMENDATIONS:  1. Probable underlying coronary artery disease based on a mild abnormal      myocardial effusion study although without active  angina or dyspnea on      exertion. Will plan to continue medical      therapy and see him back for symptom review in the next 6 months.  2. Hypertension, type 2 diabetes mellitus and hyperlipidemia, followed by      Dr. Everardo All.       [name], MD     SGM/MedQ  DD:  11/28/2005  DT:  11/29/2005  Job #:  [id number]   cc:   Gregary Signs A. Everardo All, MD

## 2010-12-05 ENCOUNTER — Other Ambulatory Visit: Payer: Self-pay | Admitting: Endocrinology

## 2010-12-07 ENCOUNTER — Encounter: Payer: Self-pay | Admitting: Endocrinology

## 2010-12-07 ENCOUNTER — Ambulatory Visit (INDEPENDENT_AMBULATORY_CARE_PROVIDER_SITE_OTHER): Payer: Medicare Other | Admitting: Endocrinology

## 2010-12-07 VITALS — BP 120/62 | HR 67 | Temp 98.4°F | Ht 69.0 in | Wt 157.0 lb

## 2010-12-07 DIAGNOSIS — J309 Allergic rhinitis, unspecified: Secondary | ICD-10-CM

## 2010-12-07 DIAGNOSIS — R259 Unspecified abnormal involuntary movements: Secondary | ICD-10-CM

## 2010-12-07 DIAGNOSIS — Z23 Encounter for immunization: Secondary | ICD-10-CM

## 2010-12-07 DIAGNOSIS — E785 Hyperlipidemia, unspecified: Secondary | ICD-10-CM

## 2010-12-07 MED ORDER — FLUTICASONE FUROATE 27.5 MCG/SPRAY NA SUSP: 2.0000 | Freq: Every day | NASAL | Status: DC

## 2010-12-07 NOTE — Patient Instructions (Addendum)
It is ok to hold-off on the 2 medications for the shaking, for now.   i have sent a prescription to your pharmacy, for a nasal spray, to help the congestion.   Please come back for a "medicare wellness" appointment in 4 months. If you take the vytorin, you don't need to take the lipitor.

## 2010-12-07 NOTE — Progress Notes (Signed)
  Subjective:    Patient ID: Alex Robinson, male    DOB: [birth year], 77 y.o.   MRN: [id number]  HPI Pt has 10 years of moderate tremor of the hands, but no assoc numbness.  He ran out of primidone and topamax 1 week ago, and says he notices no difference then.  He says he doesn't think they work.   Pt states few weeks of congestion in the nose, but no assoc earache. Pt says he takes vytorin, not lipitor. Past Medical History  Diagnosis Date  . ALLERGIC RHINITIS 09/22/2006  . ANEMIA-NOS 09/22/2006  . CARCINOMA, SKIN, SQUAMOUS CELL 01/04/2009  . Cough 02/01/2008  . DIABETES MELLITUS, TYPE II 09/22/2006  . DIVERTICULITIS, HX OF 09/22/2006  . GERD 09/22/2006  . HYPERLIPIDEMIA 09/22/2006  . HYPERTENSION 09/22/2006  . Impacted cerumen 11/06/2007  . INGUINAL HERNIA, RIGHT 12/20/2008  . OSTEOARTHRITIS 11/06/2007  . PSA, INCREASED 11/06/2007  . RECTAL BLEEDING 04/04/2010  . TREMOR 04/23/2007  . URINARY RETENTION 04/04/2010  . Vitiligo 11/06/2007    Past Surgical History  Procedure Date  . Appendectomy 1946  . Tonsillectomy and adenoidectomy 1939    History   Social History  . Marital Status: Widowed    Spouse Name: N/A    Number of Children: N/A  . Years of Education: N/A   Occupational History  . Retired    Social History Main Topics  . Smoking status: Former Games developer  . Smokeless tobacco: Not on file   Comment: quit smoking at 20  . Alcohol Use: No  . Drug Use: No  . Sexually Active: Not on file   Other Topics Concern  . Not on file   Social History Narrative   Widowed 2008Pt says he gets exercise.     Current Outpatient Prescriptions on File Prior to Visit  Medication Sig Dispense Refill  . ACTOS 30 MG tablet TAKE 1 TABLET BY MOUTH DAILY  90 tablet  1  . atorvastatin (LIPITOR) 80 MG tablet Take 1 tablet (80 mg total) by mouth daily.  90 tablet  3    Allergies  Allergen Reactions  . Niacin   . Sulfamethoxazole W/Trimethoprim     Family History  Problem Relation Age of  Onset  . Stroke Mother   . Heart attack Father     BP 120/62  Pulse 67  Temp(Src) 98.4 F (36.9 C) (Oral)  Ht 5\' 9"  (1.753 m)  Wt 157 lb (71.215 kg)  BMI 23.18 kg/m2  SpO2 98%  Review of Systems Denies weight change and fever.    Objective:   Physical Exam VITAL SIGNS:  See vs page GENERAL: no distress head: no deformity eyes: no periorbital swelling, no proptosis external nose and ears are normal mouth: no lesion seen Both eac's and tm's are normal Neuro: moderate tremor of the hands.         Assessment & Plan:  Tremor, no help with 2 meds--pt wants to hold for now--ok with me Dyslipidemia.  Pt is on 2 meds Allergic rhinitis, new

## 2011-04-09 ENCOUNTER — Encounter: Payer: Self-pay | Admitting: Endocrinology

## 2011-04-09 ENCOUNTER — Ambulatory Visit (INDEPENDENT_AMBULATORY_CARE_PROVIDER_SITE_OTHER): Payer: Medicare Other | Admitting: Endocrinology

## 2011-04-09 ENCOUNTER — Other Ambulatory Visit (INDEPENDENT_AMBULATORY_CARE_PROVIDER_SITE_OTHER): Payer: Medicare Other

## 2011-04-09 DIAGNOSIS — Z79899 Other long term (current) drug therapy: Secondary | ICD-10-CM | POA: Diagnosis not present

## 2011-04-09 DIAGNOSIS — E1129 Type 2 diabetes mellitus with other diabetic kidney complication: Secondary | ICD-10-CM

## 2011-04-09 DIAGNOSIS — E119 Type 2 diabetes mellitus without complications: Secondary | ICD-10-CM

## 2011-04-09 DIAGNOSIS — R259 Unspecified abnormal involuntary movements: Secondary | ICD-10-CM | POA: Diagnosis not present

## 2011-04-09 DIAGNOSIS — E785 Hyperlipidemia, unspecified: Secondary | ICD-10-CM

## 2011-04-09 DIAGNOSIS — I10 Essential (primary) hypertension: Secondary | ICD-10-CM | POA: Diagnosis not present

## 2011-04-09 DIAGNOSIS — R972 Elevated prostate specific antigen [PSA]: Secondary | ICD-10-CM | POA: Diagnosis not present

## 2011-04-09 DIAGNOSIS — D649 Anemia, unspecified: Secondary | ICD-10-CM | POA: Diagnosis not present

## 2011-04-09 LAB — CBC WITH DIFFERENTIAL/PLATELET
Basophils Absolute: 0.1 10*3/uL (ref 0.0–0.1)
Eosinophils Relative: 7.1 % — ABNORMAL HIGH (ref 0.0–5.0)
HCT: 42.8 % (ref 39.0–52.0)
Hemoglobin: 13.9 g/dL (ref 13.0–17.0)
Lymphocytes Relative: 21.8 % (ref 12.0–46.0)
Monocytes Relative: 10.2 % (ref 3.0–12.0)
Neutro Abs: 5.1 10*3/uL (ref 1.4–7.7)
RDW: 13.8 % (ref 11.5–14.6)
WBC: 8.5 10*3/uL (ref 4.5–10.5)

## 2011-04-09 LAB — PSA: PSA: 3.11 ng/mL (ref 0.10–4.00)

## 2011-04-09 LAB — LIPID PANEL
HDL: 37.5 mg/dL — ABNORMAL LOW (ref >39.00)
Total CHOL/HDL Ratio: 4
Triglycerides: 129 mg/dL (ref 0.0–149.0)
VLDL: 25.8 mg/dL (ref 0.0–40.0)

## 2011-04-09 LAB — HEPATIC FUNCTION PANEL
AST: 25 U/L (ref 0–37)
Alkaline Phosphatase: 97 U/L (ref 39–117)
Bilirubin, Direct: 0.1 mg/dL (ref 0.0–0.3)
Total Protein: 6.8 g/dL (ref 6.0–8.3)

## 2011-04-09 LAB — BASIC METABOLIC PANEL
CO2: 29 mEq/L (ref 19–32)
Chloride: 106 mEq/L (ref 96–112)
Creatinine, Ser: 1.3 mg/dL (ref 0.4–1.5)
Glucose, Bld: 92 mg/dL (ref 70–99)

## 2011-04-09 LAB — URINALYSIS, ROUTINE W REFLEX MICROSCOPIC
Bilirubin Urine: NEGATIVE
Ketones, ur: NEGATIVE
Leukocytes, UA: NEGATIVE
Total Protein, Urine: 100
pH: 5.5 (ref 5.0–8.0)

## 2011-04-09 LAB — HEMOGLOBIN A1C: Hgb A1c MFr Bld: 6.2 % (ref 4.6–6.5)

## 2011-04-09 LAB — MICROALBUMIN / CREATININE URINE RATIO: Creatinine,U: 182.7 mg/dL

## 2011-04-09 NOTE — Progress Notes (Signed)
Subjective:    Patient ID: Alex Robinson, male    DOB: [birth year], 79 y.o.   MRN: [id number]  HPI The state of at least three ongoing medical problems is addressed today: Pt returns for f/u of type 2 DM (2007).  pt states he feels well in general, except for persistent tremor. Dyslipidemia:  Denies weight change HTN: denies chest pain/sob Past Medical History  Diagnosis Date  . ALLERGIC RHINITIS 09/22/2006  . ANEMIA-NOS 09/22/2006  . CARCINOMA, SKIN, SQUAMOUS CELL 01/04/2009  . Cough 02/01/2008  . DIABETES MELLITUS, TYPE II 09/22/2006  . DIVERTICULITIS, HX OF 09/22/2006  . GERD 09/22/2006  . HYPERLIPIDEMIA 09/22/2006  . HYPERTENSION 09/22/2006  . Impacted cerumen 11/06/2007  . INGUINAL HERNIA, RIGHT 12/20/2008  . OSTEOARTHRITIS 11/06/2007  . PSA, INCREASED 11/06/2007  . RECTAL BLEEDING 04/04/2010  . TREMOR 04/23/2007  . URINARY RETENTION 04/04/2010  . Vitiligo 11/06/2007    Past Surgical History  Procedure Date  . Appendectomy 1946  . Tonsillectomy and adenoidectomy 1939    History   Social History  . Marital Status: Widowed    Spouse Name: N/A    Number of Children: N/A  . Years of Education: N/A   Occupational History  . Retired    Social History Main Topics  . Smoking status: Former Games developer  . Smokeless tobacco: Not on file   Comment: quit smoking at 20  . Alcohol Use: No  . Drug Use: No  . Sexually Active: Not on file   Other Topics Concern  . Not on file   Social History Narrative   Widowed 2008Pt says he gets exercise.     Current Outpatient Prescriptions on File Prior to Visit  Medication Sig Dispense Refill  . ACTOS 30 MG tablet TAKE 1 TABLET BY MOUTH DAILY  90 tablet  1  . amLODipine (NORVASC) 5 MG tablet Take 5 mg by mouth daily.        [name] Kitchen aspirin 81 MG tablet Take 81 mg by mouth daily.        [name] Kitchen atorvastatin (LIPITOR) 80 MG tablet Take 1 tablet (80 mg total) by mouth daily.  90 tablet  3  . celecoxib (CELEBREX) 200 MG capsule Take 200 mg by mouth daily.         . metoprolol (TOPROL-XL) 100 MG 24 hr tablet 1/2 tablet by mouth once daily       . Omega-3 Fatty Acids (FISH OIL) 1200 MG CAPS Take 1 capsule by mouth 2 (two) times daily.        . Tamsulosin HCl (FLOMAX) 0.4 MG CAPS Take 0.4 mg by mouth 2 (two) times daily.       . fluticasone (VERAMYST) 27.5 MCG/SPRAY nasal spray Place 2 sprays into the nose daily.  10 g  11  . lisinopril-hydrochlorothiazide (PRINZIDE,ZESTORETIC) 20-12.5 MG per tablet Take 1 tablet by mouth daily.          Allergies  Allergen Reactions  . Niacin   . Sulfamethoxazole W/Trimethoprim     Family History  Problem Relation Age of Onset  . Stroke Mother   . Heart attack Father     BP 118/76  Pulse 66  Temp(Src) 97.5 F (36.4 C) (Oral)  Ht 5\' 9"  (1.753 m)  Wt 166 lb (75.297 kg)  BMI 24.51 kg/m2  SpO2 96%    Review of Systems  Gastrointestinal: Negative for anal bleeding.  Genitourinary: Negative for hematuria and difficulty urinating.       Objective:   Physical  Exam VITAL SIGNS:  See vs page GENERAL: no distress Pulses: dorsalis pedis intact bilat.   Feet: no deformity.  no ulcer on the feet.  feet are of normal color and temp.  no edema Neuro: sensation is intact to touch on the feet   Lab Results  Component Value Date   WBC 8.5 04/09/2011   HGB 13.9 04/09/2011   HCT 42.8 04/09/2011   PLT 173.0 04/09/2011   GLUCOSE 92 04/09/2011   CHOL 136 04/09/2011   TRIG 129.0 04/09/2011   HDL 37.50* 04/09/2011   LDLDIRECT 65.8 11/06/2007   LDLCALC 73 04/09/2011   ALT 23 04/09/2011   AST 25 04/09/2011   NA 143 04/09/2011   K 4.4 04/09/2011   CL 106 04/09/2011   CREATININE 1.3 04/09/2011   BUN 24* 04/09/2011   CO2 29 04/09/2011   TSH 0.95 04/09/2011   PSA 3.11 04/09/2011   HGBA1C 6.2 04/09/2011   MICROALBUR 62.8* 04/09/2011      Assessment & Plan:  DM.  well-controlled Dyslipidemia, well-controlled HTN, well-controlled

## 2011-04-09 NOTE — Patient Instructions (Addendum)
Please come back for a "medicare wellness" appointment in 4 months. blood tests are being requested for you today.  please call [phone] to hear your test results.  You will be prompted to enter the 9-digit "MRN" number that appears at the top left of this page, followed by #.  Then you will hear the message. (update: i left message on phone-tree:  rx as we discussed) 

## 2011-05-30 ENCOUNTER — Other Ambulatory Visit: Payer: Self-pay | Admitting: Endocrinology

## 2011-06-10 ENCOUNTER — Other Ambulatory Visit: Payer: Self-pay | Admitting: Endocrinology

## 2011-06-14 ENCOUNTER — Ambulatory Visit (INDEPENDENT_AMBULATORY_CARE_PROVIDER_SITE_OTHER): Payer: Medicare Other | Admitting: Endocrinology

## 2011-06-14 ENCOUNTER — Encounter: Payer: Self-pay | Admitting: Endocrinology

## 2011-06-14 VITALS — BP 142/82 | HR 64 | Temp 98.1°F | Ht 69.0 in | Wt 164.0 lb

## 2011-06-14 DIAGNOSIS — L570 Actinic keratosis: Secondary | ICD-10-CM

## 2011-06-14 DIAGNOSIS — H16139 Photokeratitis, unspecified eye: Secondary | ICD-10-CM

## 2011-06-14 DIAGNOSIS — I10 Essential (primary) hypertension: Secondary | ICD-10-CM | POA: Diagnosis not present

## 2011-06-14 NOTE — Patient Instructions (Addendum)
Your regular physical is due in 2 months.  we'll recheck the blood-pressure then. The "freezing" treatment you had today sometimes has to be repeated.

## 2011-06-14 NOTE — Progress Notes (Signed)
Subjective:    Patient ID: Alex Robinson, male    DOB: 1925-03-11, 78 y.o.   MRN: [id number]  HPI Pt states a few weeks of slight nodule at the right side of the nose.  No assoc bleeding Past Medical History  Diagnosis Date  . ALLERGIC RHINITIS 09/22/2006  . ANEMIA-NOS 09/22/2006  . CARCINOMA, SKIN, SQUAMOUS CELL 01/04/2009  . Cough 02/01/2008  . DIABETES MELLITUS, TYPE II 09/22/2006  . DIVERTICULITIS, HX OF 09/22/2006  . GERD 09/22/2006  . HYPERLIPIDEMIA 09/22/2006  . HYPERTENSION 09/22/2006  . Impacted cerumen 11/06/2007  . INGUINAL HERNIA, RIGHT 12/20/2008  . OSTEOARTHRITIS 11/06/2007  . PSA, INCREASED 11/06/2007  . RECTAL BLEEDING 04/04/2010  . TREMOR 04/23/2007  . URINARY RETENTION 04/04/2010  . Vitiligo 11/06/2007    Past Surgical History  Procedure Date  . Appendectomy 1946  . Tonsillectomy and adenoidectomy 1939    History   Social History  . Marital Status: Widowed    Spouse Name: N/A    Number of Children: N/A  . Years of Education: N/A   Occupational History  . Retired    Social History Main Topics  . Smoking status: Former Games developer  . Smokeless tobacco: Not on file   Comment: quit smoking at 20  . Alcohol Use: No  . Drug Use: No  . Sexually Active: Not on file   Other Topics Concern  . Not on file   Social History Narrative   Widowed 2008Pt says he gets exercise.     Current Outpatient Prescriptions on File Prior to Visit  Medication Sig Dispense Refill  . ACTOS 30 MG tablet TAKE 1 TABLET BY MOUTH DAILY  90 tablet  1  . amLODipine (NORVASC) 5 MG tablet TAKE 1 TABLET BY MOUTH EVERY DAY  90 tablet  3  . aspirin 81 MG tablet Take 81 mg by mouth daily.        [name] Kitchen atorvastatin (LIPITOR) 80 MG tablet Take 1 tablet (80 mg total) by mouth daily.  90 tablet  3  . celecoxib (CELEBREX) 200 MG capsule Take 200 mg by mouth daily.        . fluticasone (VERAMYST) 27.5 MCG/SPRAY nasal spray Place 2 sprays into the nose daily.  10 g  11  . lisinopril-hydrochlorothiazide  (PRINZIDE,ZESTORETIC) 20-12.5 MG per tablet Take 1 tablet by mouth daily.        . metoprolol succinate (TOPROL-XL) 100 MG 24 hr tablet TAKE 1/2 TABLET BY MOUTH ONCE A DAY  45 tablet  5  . Omega-3 Fatty Acids (FISH OIL) 1200 MG CAPS Take 1 capsule by mouth 2 (two) times daily.        . Tamsulosin HCl (FLOMAX) 0.4 MG CAPS Take 0.4 mg by mouth 2 (two) times daily.         Allergies  Allergen Reactions  . Niacin   . Sulfamethoxazole W/Trimethoprim     Family History  Problem Relation Age of Onset  . Stroke Mother   . Heart attack Father     BP 142/82  Pulse 64  Temp(Src) 98.1 F (36.7 C) (Oral)  Ht 5\' 9"  (1.753 m)  Wt 164 lb (74.39 kg)  BMI 24.22 kg/m2  SpO2 96%  Review of Systems He also has a nodule at the left ear.  No otalgia    Objective:   Physical Exam VITAL SIGNS:  See vs page GENERAL: no distress Right side of the nose:  1 cm wart Left ear: 1 cm AK  Assessment & Plan:  HTN, with ? Of situational component AK of left ear, new Wart of the face, new  Liquid N2 is applied to both

## 2011-06-16 DIAGNOSIS — H16139 Photokeratitis, unspecified eye: Secondary | ICD-10-CM | POA: Insufficient documentation

## 2011-07-16 ENCOUNTER — Other Ambulatory Visit: Payer: Self-pay | Admitting: Endocrinology

## 2011-08-06 ENCOUNTER — Encounter: Payer: Self-pay | Admitting: Endocrinology

## 2011-08-06 ENCOUNTER — Ambulatory Visit (INDEPENDENT_AMBULATORY_CARE_PROVIDER_SITE_OTHER): Payer: Medicare Other | Admitting: Endocrinology

## 2011-08-06 VITALS — BP 124/68 | HR 78 | Temp 97.7°F | Ht 69.0 in | Wt 167.0 lb

## 2011-08-06 DIAGNOSIS — Z0000 Encounter for general adult medical examination without abnormal findings: Secondary | ICD-10-CM | POA: Diagnosis not present

## 2011-08-06 DIAGNOSIS — E1129 Type 2 diabetes mellitus with other diabetic kidney complication: Secondary | ICD-10-CM

## 2011-08-06 DIAGNOSIS — Z129 Encounter for screening for malignant neoplasm, site unspecified: Secondary | ICD-10-CM | POA: Diagnosis not present

## 2011-08-06 MED ORDER — CELECOXIB 200 MG PO CAPS: 200.0000 mg | ORAL_CAPSULE | Freq: Every day | ORAL | Status: DC

## 2011-08-06 MED ORDER — ATORVASTATIN CALCIUM 80 MG PO TABS: ORAL_TABLET | ORAL | Status: DC

## 2011-08-06 MED ORDER — PIOGLITAZONE HCL 30 MG PO TABS: ORAL_TABLET | ORAL | Status: DC

## 2011-08-06 MED ORDER — AMLODIPINE BESYLATE 5 MG PO TABS: ORAL_TABLET | ORAL | Status: DC

## 2011-08-06 MED ORDER — METOPROLOL SUCCINATE ER 100 MG PO TB24: ORAL_TABLET | ORAL | Status: DC

## 2011-08-06 MED ORDER — FLUTICASONE FUROATE 27.5 MCG/SPRAY NA SUSP: 2.0000 | Freq: Every day | NASAL | Status: DC

## 2011-08-06 NOTE — Progress Notes (Signed)
Subjective:    Patient ID: Alex Robinson, male    DOB: [birth year], 78 y.o.   MRN: [id number]  HPI    Review of Systems     Objective:   Physical Exam        Assessment & Plan:    Subjective:   Patient here for Medicare annual wellness visit and management of other chronic and acute problems.     Risk factors: advanced age    Roster of Physicians Providing Medical Care to Patient:  See "snapshot"   Activities of Daily Living: In your present state of health, do you have any difficulty performing the following activities?:  Preparing food and eating?: No  Bathing yourself: No  Getting dressed: No  Using the toilet:No  Moving around from place to place: No  In the past year have you fallen or had a near fall?: No    Home Safety: Has smoke detector and wears seat belts. No firearms. No excess sun exposure.  Diet and Exercise  Current exercise habits:  Pt says good Dietary issues discussed: pt reports a healthy diet   Depression Screen  Q1: Over the past two weeks, have you felt down, depressed or hopeless? no  Q2: Over the past two weeks, have you felt little interest or pleasure in doing things? no   The following portions of the patient's history were reviewed and updated as appropriate: allergies, current medications, past family history, past medical history, past social history, past surgical history and problem list.  Past Medical History  Diagnosis Date  . ALLERGIC RHINITIS 09/22/2006  . ANEMIA-NOS 09/22/2006  . CARCINOMA, SKIN, SQUAMOUS CELL 01/04/2009  . Cough 02/01/2008  . DIABETES MELLITUS, TYPE II 09/22/2006  . DIVERTICULITIS, HX OF 09/22/2006  . GERD 09/22/2006  . HYPERLIPIDEMIA 09/22/2006  . HYPERTENSION 09/22/2006  . Impacted cerumen 11/06/2007  . INGUINAL HERNIA, RIGHT 12/20/2008  . OSTEOARTHRITIS 11/06/2007  . PSA, INCREASED 11/06/2007  . RECTAL BLEEDING 04/04/2010  . TREMOR 04/23/2007  . URINARY RETENTION 04/04/2010  . Vitiligo 11/06/2007    Past  Surgical History  Procedure Date  . Appendectomy 1946  . Tonsillectomy and adenoidectomy 1939    History   Social History  . Marital Status: Widowed    Spouse Name: N/A    Number of Children: N/A  . Years of Education: N/A   Occupational History  . Retired    Social History Main Topics  . Smoking status: Former Games developer  . Smokeless tobacco: Not on file   Comment: quit smoking at 20  . Alcohol Use: No  . Drug Use: No  . Sexually Active: Not on file   Other Topics Concern  . Not on file   Social History Narrative   Widowed 2008Pt says he gets exercise.     Current Outpatient Prescriptions on File Prior to Visit  Medication Sig Dispense Refill  . amLODipine (NORVASC) 5 MG tablet TAKE 1 TABLET BY MOUTH EVERY DAY  90 tablet  3  . aspirin 81 MG tablet Take 81 mg by mouth daily.        [name] Kitchen atorvastatin (LIPITOR) 80 MG tablet TAKE 1 TABLET BY MOUTH EVERY DAY  90 tablet  1  . fluticasone (VERAMYST) 27.5 MCG/SPRAY nasal spray Place 2 sprays into the nose daily.  10 g  11  . metoprolol succinate (TOPROL-XL) 100 MG 24 hr tablet TAKE 1/2 TABLET BY MOUTH ONCE A DAY  45 tablet  5  . Omega-3 Fatty Acids (FISH OIL) 1200  MG CAPS Take 1 capsule by mouth 2 (two) times daily.        . Tamsulosin HCl (FLOMAX) 0.4 MG CAPS Take 0.4 mg by mouth 2 (two) times daily.       . ACTOS 30 MG tablet TAKE 1 TABLET BY MOUTH DAILY  90 tablet  1  . celecoxib (CELEBREX) 200 MG capsule Take 200 mg by mouth daily.        [name] Kitchen lisinopril-hydrochlorothiazide (PRINZIDE,ZESTORETIC) 20-12.5 MG per tablet Take 1 tablet by mouth daily.          Allergies  Allergen Reactions  . Niacin   . Sulfamethoxazole W-Trimethoprim     Family History  Problem Relation Age of Onset  . Stroke Mother   . Heart attack Father     BP 124/68  Pulse 78  Temp(Src) 97.7 F (36.5 C) (Oral)  Ht 5\' 9"  (1.753 m)  Wt 167 lb (75.751 kg)  BMI 24.66 kg/m2  SpO2 94%   Review of Systems  hearing loss is mild; denies visual  loss Objective:   Vision:  Sees opthalmologist Hearing: grossly normal Body mass index:  See vs page Msk: pt easily and quickly performs "get-up-and-go" from a sitting position Cognitive Impairment Assessment: cognition, memory and judgment appear normal.  remembers 3/3 at 5 minutes.  excellent recall.  can easily read and write a sentence (except as limited by tremor).  alert and oriented x 3   Assessment:   Medicare wellness utd on preventive parameters    Plan:   During the course of the visit the patient was educated and counseled about appropriate screening and preventive services including:        Fall prevention    Diabetes screening  Nutrition counseling   Vaccines / LABS Zostavax / Pnemonccoal Vaccine  today   Patient Instructions (the written plan) was given to the patient.

## 2011-08-06 NOTE — Patient Instructions (Addendum)
please consider these measures for your health:  minimize alcohol.  do not use tobacco products.  have a colonoscopy at least every 10 years from age 78.  keep firearms safely stored.  always use seat belts.  have working smoke alarms in your home.  see an eye doctor and dentist regularly.  never drive under the influence of alcohol or drugs (including prescription drugs).  those with fair skin should take precautions against the sun. please let me know what your wishes would be, if artificial life support measures should become necessary.  it is critically important to prevent falling down (keep floor areas well-lit, dry, and free of loose objects.  If you have a cane, walker, or wheelchair, you should use it, even for short trips around the house.  Also, try not to rush) good diet and exercise habits significanly improve the control of your blood sugar.  please let me know if you wish to be referred to a dietician.  high blood sugar is very risky to your health.  you should see an eye doctor every year. Please come back for a follow-up appointment in 6 months. You should have a vaccine against shingles (a painful rash which results from the  chickenpox infection which most people had many years ago).  This vaccine reduces, but does not totally eliminate the risk of shingles.  Because this is a medicare part d benefit, you should get it at a pharmacy.   Please have the colonoscopy done again.  you will receive a phone call, about a day and time for an appointment

## 2011-08-07 ENCOUNTER — Telehealth: Payer: Self-pay | Admitting: *Deleted

## 2011-08-07 MED ORDER — FLUTICASONE PROPIONATE 50 MCG/ACT NA SUSP: 2.0000 | Freq: Every day | NASAL | Status: DC

## 2011-08-07 NOTE — Telephone Encounter (Signed)
i changed veramist to generic flonase, and i sent rx Change celebrex to alleve When you are on alleve, take prilosec 20 mg qd along with it, to protect your stomach

## 2011-08-07 NOTE — Telephone Encounter (Signed)
R'cd fax from [facility] for PA of Veramyst and Celebrex. Per insurance, pt must try a generic NSAID for 30 days before PA for Celebrex can be completed. Pt also must try two formulary alternatives for Veramyst (Nasonex, generic Fluticasone, Flunisolide or Triamcinolone nasal spray) before PA can be completed.

## 2011-08-08 ENCOUNTER — Encounter: Payer: Self-pay | Admitting: Gastroenterology

## 2011-08-08 NOTE — Telephone Encounter (Signed)
Pt informed of MD's advisement for Aleve and rx change to Flonase.

## 2011-08-10 DIAGNOSIS — Z0000 Encounter for general adult medical examination without abnormal findings: Secondary | ICD-10-CM | POA: Insufficient documentation

## 2011-08-23 DIAGNOSIS — N400 Enlarged prostate without lower urinary tract symptoms: Secondary | ICD-10-CM | POA: Diagnosis not present

## 2011-09-09 ENCOUNTER — Ambulatory Visit: Payer: Medicare Other | Admitting: Gastroenterology

## 2011-09-30 ENCOUNTER — Encounter: Payer: Self-pay | Admitting: Gastroenterology

## 2011-09-30 ENCOUNTER — Ambulatory Visit (INDEPENDENT_AMBULATORY_CARE_PROVIDER_SITE_OTHER): Payer: Medicare Other | Admitting: Gastroenterology

## 2011-09-30 VITALS — BP 150/90 | HR 83 | Ht 69.0 in | Wt 162.2 lb

## 2011-09-30 DIAGNOSIS — K5900 Constipation, unspecified: Secondary | ICD-10-CM | POA: Diagnosis not present

## 2011-09-30 MED ORDER — NA SULFATE-K SULFATE-MG SULF 17.5-3.13-1.6 GM/177ML PO SOLN: 1.0000 | Freq: Once | ORAL | Status: AC

## 2011-09-30 NOTE — Progress Notes (Signed)
History of Present Illness: Alex Robinson 78 year old white male with diabetes referred at the request of Dr. Everardo All for colonoscopy. Except for mild constipation he is without GI complaints. On one occasion he had limited rectal bleeding. Last colonoscopy was 2003. He denies change of bowel habits, abdominal pain or melena.    Past Medical History  Diagnosis Date  . ALLERGIC RHINITIS 09/22/2006  . ANEMIA-NOS 09/22/2006  . CARCINOMA, SKIN, SQUAMOUS CELL 01/04/2009  . Cough 02/01/2008  . DIABETES MELLITUS, TYPE II 09/22/2006  . DIVERTICULITIS, HX OF 09/22/2006  . GERD 09/22/2006  . HYPERLIPIDEMIA 09/22/2006  . HYPERTENSION 09/22/2006  . Impacted cerumen 11/06/2007  . INGUINAL HERNIA, RIGHT 12/20/2008  . OSTEOARTHRITIS 11/06/2007  . PSA, INCREASED 11/06/2007  . RECTAL BLEEDING 04/04/2010  . TREMOR 04/23/2007    in hands  . URINARY RETENTION 04/04/2010  . Vitiligo 11/06/2007   Past Surgical History  Procedure Date  . Appendectomy 1946  . Tonsillectomy and adenoidectomy 1939  . Inner ear surgery 1960   family history includes Heart attack in his father and Stroke in his mother. Current Outpatient Prescriptions  Medication Sig Dispense Refill  . amLODipine (NORVASC) 5 MG tablet TAKE 1 TABLET BY MOUTH EVERY DAY  90 tablet  3  . aspirin 81 MG tablet Take 81 mg by mouth daily.        [name] Kitchen atorvastatin (LIPITOR) 80 MG tablet TAKE 1 TABLET BY MOUTH EVERY DAY  90 tablet  3  . fluticasone (FLONASE) 50 MCG/ACT nasal spray Place 2 sprays into the nose daily.  16 g  6  . lisinopril-hydrochlorothiazide (PRINZIDE,ZESTORETIC) 20-12.5 MG per tablet Take 1 tablet by mouth daily.        . metoprolol succinate (TOPROL-XL) 100 MG 24 hr tablet TAKE 1/2 TABLET BY MOUTH ONCE A DAY  45 tablet  3  . Omega-3 Fatty Acids (FISH OIL) 1200 MG CAPS Take 1 capsule by mouth 2 (two) times daily.        . pioglitazone (ACTOS) 30 MG tablet TAKE 1 TABLET BY MOUTH DAILY  90 tablet  3  . Tamsulosin HCl (FLOMAX) 0.4 MG CAPS Take 0.4 mg by  mouth 2 (two) times daily.        Allergies as of 09/30/2011 - Review Complete 09/30/2011  Allergen Reaction Noted  . Niacin    . Sulfamethoxazole w-trimethoprim      reports that he has quit smoking. He has never used smokeless tobacco. He reports that he does not drink alcohol or use illicit drugs.     Review of Systems: He complains of a resting tremor Pertinent positive and negative review of systems were noted in the above HPI section. All other review of systems were otherwise negative.  Vital signs were reviewed in today's medical record Physical Exam: General: Well developed , well nourished, no acute distress Head: Normocephalic and atraumatic Eyes:  sclerae anicteric, EOMI Ears: Normal auditory acuity Mouth: No deformity or lesions Neck: Supple, no masses or thyromegaly Lungs: Clear throughout to auscultation Heart: Regular rate and rhythm; no murmurs, rubs or bruits Abdomen: Soft, non tender and non distended. No masses, hepatosplenomegaly or hernias noted. Normal Bowel sounds Rectal:deferred Musculoskeletal: Symmetrical with no Rampersaud deformities  Skin: No lesions on visible extremities Pulses:  Normal pulses noted Extremities: No clubbing, cyanosis, edema or deformities noted Neurological: Alert oriented x 4, grossly nonfocal Cervical Nodes:  No significant cervical adenopathy Inguinal Nodes: No significant inguinal adenopathy Psychological:  Alert and cooperative. Normal mood and affect

## 2011-09-30 NOTE — Patient Instructions (Addendum)
We have scheduled you for your colonoscopy Separate instructions have been given We have sent in your Suprep to your pharmacy today

## 2011-09-30 NOTE — Assessment & Plan Note (Signed)
Patient has mild constipation. No specific therapy is required. Plan screening colonoscopy.

## 2011-11-11 ENCOUNTER — Telehealth: Payer: Self-pay | Admitting: Gastroenterology

## 2011-11-11 NOTE — Telephone Encounter (Signed)
Pt called confirming his appt on 9/18 for his colonoscopy. Reviewed pt instructions with the pt over the phone, all of his questions were answered.

## 2011-11-14 ENCOUNTER — Ambulatory Visit (AMBULATORY_SURGERY_CENTER): Payer: Medicare Other | Admitting: Gastroenterology

## 2011-11-14 ENCOUNTER — Encounter: Payer: Self-pay | Admitting: Gastroenterology

## 2011-11-14 VITALS — BP 120/73 | HR 64 | Temp 98.1°F | Resp 14 | Ht 69.0 in | Wt 162.0 lb

## 2011-11-14 DIAGNOSIS — K5900 Constipation, unspecified: Secondary | ICD-10-CM | POA: Diagnosis not present

## 2011-11-14 DIAGNOSIS — R109 Unspecified abdominal pain: Secondary | ICD-10-CM | POA: Diagnosis not present

## 2011-11-14 DIAGNOSIS — K625 Hemorrhage of anus and rectum: Secondary | ICD-10-CM | POA: Diagnosis not present

## 2011-11-14 DIAGNOSIS — I10 Essential (primary) hypertension: Secondary | ICD-10-CM | POA: Diagnosis not present

## 2011-11-14 DIAGNOSIS — E119 Type 2 diabetes mellitus without complications: Secondary | ICD-10-CM | POA: Diagnosis not present

## 2011-11-14 DIAGNOSIS — K5730 Diverticulosis of large intestine without perforation or abscess without bleeding: Secondary | ICD-10-CM

## 2011-11-14 MED ORDER — SODIUM CHLORIDE 0.9 % IV SOLN: 500.0000 mL | INTRAVENOUS | Status: DC

## 2011-11-14 NOTE — Op Note (Signed)
 [facility] [address] [place], [address]   COLONOSCOPY PROCEDURE REPORT  PATIENT: Alex Robinson, Alex Robinson  MR#: [id number] BIRTHDATE: 1925/11/14 , 85  yrs. old GENDER: Male ENDOSCOPIST: Louis Meckel, MD REFERRED XB:JYNW Ardeen Garland, M.D. PROCEDURE DATE:  11/14/2011 PROCEDURE:   Colonoscopy, diagnostic ASA CLASS:   Class II INDICATIONS:change in bowel habits. MEDICATIONS: MAC sedation, administered by CRNA and propofol (Diprivan) 250mg  IV  DESCRIPTION OF PROCEDURE:   After the risks benefits and alternatives of the procedure were thoroughly explained, informed consent was obtained.  A digital rectal exam revealed no abnormalities of the rectum.   The LB [id number]  endoscope was introduced through the anus and advanced to the cecum, which was identified by both the appendix and ileocecal valve. No adverse events experienced.   The quality of the prep was Suprep excellent The instrument was then slowly withdrawn as the colon was fully examined.      COLON FINDINGS: Mild diverticulosis was noted in the sigmoid colon. Retroflexed views revealed no abnormalities. The time to cecum=5 minutes 24 seconds.  Withdrawal time=6 minutes 32 seconds.  The scope was withdrawn and the procedure completed. COMPLICATIONS: There were no complications.  ENDOSCOPIC IMPRESSION: Mild diverticulosis was noted in the sigmoid colon  RECOMMENDATIONS: High fiber diet   eSigned:  Louis Meckel, MD 11/14/2011 11:47 AM   cc:

## 2011-11-14 NOTE — Progress Notes (Signed)
Patient did not have preoperative order for IV antibiotic SSI prophylaxis. (G8918)  Patient did not experience any of the following events: a burn prior to discharge; a fall within the facility; wrong site/side/patient/procedure/implant event; or a hospital transfer or hospital admission upon discharge from the facility. (G8907)  

## 2011-11-14 NOTE — Patient Instructions (Addendum)
YOU HAD AN ENDOSCOPIC PROCEDURE TODAY AT THE [facility]: Refer to the procedure report that was given to you for any specific questions about what was found during the examination.  If the procedure report does not answer your questions, please call your gastroenterologist to clarify.  If you requested that your care partner not be given the details of your procedure findings, then the procedure report has been included in a sealed envelope for you to review at your convenience later.  YOU SHOULD EXPECT: Some feelings of bloating in the abdomen. Passage of more gas than usual.  Walking can help get rid of the air that was put into your GI tract during the procedure and reduce the bloating. If you had a lower endoscopy (such as a colonoscopy or flexible sigmoidoscopy) you may notice spotting of blood in your stool or on the toilet paper. If you underwent a bowel prep for your procedure, then you may not have a normal bowel movement for a few days.  DIET: Your first meal following the procedure should be a light meal and then it is ok to progress to your normal diet.  A half-sandwich or bowl of soup is an example of a good first meal.  Heavy or fried foods are harder to digest and may make you feel nauseous or bloated.  Likewise meals heavy in dairy and vegetables can cause extra gas to form and this can also increase the bloating.  Drink plenty of fluids but you should avoid alcoholic beverages for 24 hours.  ACTIVITY: Your care partner should take you home directly after the procedure.  You should plan to take it easy, moving slowly for the rest of the day.  You can resume normal activity the day after the procedure however you should NOT DRIVE or use heavy machinery for 24 hours (because of the sedation medicines used during the test).    SYMPTOMS TO REPORT IMMEDIATELY: A gastroenterologist can be reached at any hour.  During normal business hours, 8:30 AM to 5:00 PM Monday through Friday,  call [phone].  After hours and on weekends, please call the GI answering service at [phone] who will take a message and have the physician on call contact you.   Following lower endoscopy (colonoscopy or flexible sigmoidoscopy):  Excessive amounts of blood in the stool  Significant tenderness or worsening of abdominal pains  Swelling of the abdomen that is new, acute  Fever of 100F or higher   FOLLOW UP:  Our staff will call the home number listed on your records the next business day following your procedure to check on you and address any questions or concerns that you may have at that time regarding the information given to you following your procedure. This is a courtesy call and so if there is no answer at the home number and we have not heard from you through the emergency physician on call, we will assume that you have returned to your regular daily activities without incident.  SIGNATURES/CONFIDENTIALITY: You and/or your care partner have signed paperwork which will be entered into your electronic medical record.  These signatures attest to the fact that that the information above on your After Visit Summary has been reviewed and is understood.  Full responsibility of the confidentiality of this discharge information lies with you and/or your care-partner.   Ok to resume your normal medications  Follow a high fiber diet- see handout 

## 2011-11-15 ENCOUNTER — Telehealth: Payer: Self-pay | Admitting: *Deleted

## 2011-11-15 NOTE — Telephone Encounter (Signed)
  Follow up Call-  Call back number 11/14/2011  Post procedure Call Back phone  # [phone]  Permission to leave phone message Yes     Patient questions:  Do you have a fever, pain , or abdominal swelling? no Pain Score  0 *  Have you tolerated food without any problems yes  Have you been able to return to your normal activities? yes  Do you have any questions about your discharge instructions: Diet   no Medications  no Follow up visit  no  Do you have questions or concerns about your Care? no  Actions: * If pain score is 4 or above: No action needed, pain <4.

## 2011-11-27 DIAGNOSIS — E119 Type 2 diabetes mellitus without complications: Secondary | ICD-10-CM | POA: Diagnosis not present

## 2012-01-16 ENCOUNTER — Other Ambulatory Visit: Payer: Self-pay | Admitting: Endocrinology

## 2012-02-04 ENCOUNTER — Ambulatory Visit (INDEPENDENT_AMBULATORY_CARE_PROVIDER_SITE_OTHER): Payer: Medicare Other | Admitting: Endocrinology

## 2012-02-04 ENCOUNTER — Encounter: Payer: Self-pay | Admitting: Endocrinology

## 2012-02-04 VITALS — BP 126/68 | HR 70 | Temp 98.2°F | Wt 168.0 lb

## 2012-02-04 DIAGNOSIS — E1129 Type 2 diabetes mellitus with other diabetic kidney complication: Secondary | ICD-10-CM | POA: Diagnosis not present

## 2012-02-04 DIAGNOSIS — E119 Type 2 diabetes mellitus without complications: Secondary | ICD-10-CM | POA: Diagnosis not present

## 2012-02-04 NOTE — Progress Notes (Signed)
Subjective:    Patient ID: Alex Robinson, male    DOB: 25-Dec-1925, 78 y.o.   MRN: [id number]  HPI Pt returns for f/u of type 2 DM (dx'ed 2007; complicated by nephropathy).  pt states he feels well in general.  no cbg record, but states cbg's are well-controlled Past Medical History  Diagnosis Date  . ALLERGIC RHINITIS 09/22/2006  . ANEMIA-NOS 09/22/2006  . CARCINOMA, SKIN, SQUAMOUS CELL 01/04/2009  . Cough 02/01/2008  . DIABETES MELLITUS, TYPE II 09/22/2006  . DIVERTICULITIS, HX OF 09/22/2006  . GERD 09/22/2006  . HYPERLIPIDEMIA 09/22/2006  . HYPERTENSION 09/22/2006  . Impacted cerumen 11/06/2007  . INGUINAL HERNIA, RIGHT 12/20/2008  . OSTEOARTHRITIS 11/06/2007  . PSA, INCREASED 11/06/2007  . RECTAL BLEEDING 04/04/2010  . TREMOR 04/23/2007    in hands  . URINARY RETENTION 04/04/2010  . Vitiligo 11/06/2007  . Asthma     as a child  . Neuromuscular disorder     tremor- hands    Past Surgical History  Procedure Date  . Appendectomy 1946  . Tonsillectomy and adenoidectomy 1939  . Inner ear surgery 1960  . Cataract extraction     History   Social History  . Marital Status: Widowed    Spouse Name: N/A    Number of Children: 3  . Years of Education: N/A   Occupational History  . Retired    Social History Main Topics  . Smoking status: Former Games developer  . Smokeless tobacco: Never Used     Comment: quit smoking at 20  . Alcohol Use: No  . Drug Use: No  . Sexually Active: Not on file   Other Topics Concern  . Not on file   Social History Narrative   Widowed 2008Pt says he gets exercise.     Current Outpatient Prescriptions on File Prior to Visit  Medication Sig Dispense Refill  . amLODipine (NORVASC) 5 MG tablet TAKE 1 TABLET BY MOUTH EVERY DAY  90 tablet  3  . aspirin 81 MG tablet Take 81 mg by mouth daily.        [name] Kitchen atorvastatin (LIPITOR) 80 MG tablet TAKE 1 TABLET BY MOUTH EVERY DAY  90 tablet  3  . fluticasone (FLONASE) 50 MCG/ACT nasal spray Place 2 sprays into the nose  daily.  16 g  6  . lisinopril-hydrochlorothiazide (PRINZIDE,ZESTORETIC) 20-12.5 MG per tablet Take 1 tablet by mouth daily.        . metoprolol succinate (TOPROL-XL) 100 MG 24 hr tablet TAKE 1/2 TABLET BY MOUTH ONCE A DAY  45 tablet  3  . Omega-3 Fatty Acids (FISH OIL) 1200 MG CAPS Take 1 capsule by mouth 2 (two) times daily.        . pioglitazone (ACTOS) 30 MG tablet TAKE 1 TABLET BY MOUTH DAILY  90 tablet  3  . Tamsulosin HCl (FLOMAX) 0.4 MG CAPS Take 0.4 mg by mouth 2 (two) times daily.         Allergies  Allergen Reactions  . Niacin Hives  . Sulfamethoxazole W-Trimethoprim Hives    Family History  Problem Relation Age of Onset  . Stroke Mother   . Heart attack Father   . Colon cancer Neg Hx   . Esophageal cancer Neg Hx   . Stomach cancer Neg Hx   . Rectal cancer Neg Hx     BP 126/68  Pulse 70  Temp 98.2 F (36.8 C) (Oral)  Wt 168 lb (76.204 kg)  SpO2 96%    Review  of Systems Denies edema.    Objective:   Physical Exam Pulses: dorsalis pedis intact bilat.   Feet: no deformity.  no ulcer on the feet.  feet are of normal color and temp.  no edema.  There is bilateral onychomycosis.   Neuro: sensation is intact to touch on the feet.     Lab Results  Component Value Date   HGBA1C 5.8* 02/04/2012      Assessment & Plan:  DM: well-controlled

## 2012-02-04 NOTE — Patient Instructions (Addendum)
blood tests are being requested for you today.  We'll contact you with results. Please continue the same medications Please come in for a "medicare wellness" appointment after 08/05/12.

## 2012-05-02 IMAGING — CR DG CHEST 2V
2 series · 2 of 2 positions shown · non-contrast
Comparison: 02/06/2009 and 02/01/2008

CLINICAL DATA: Cough

CHEST - 2 VIEW

[view not recorded (1 of 2)]
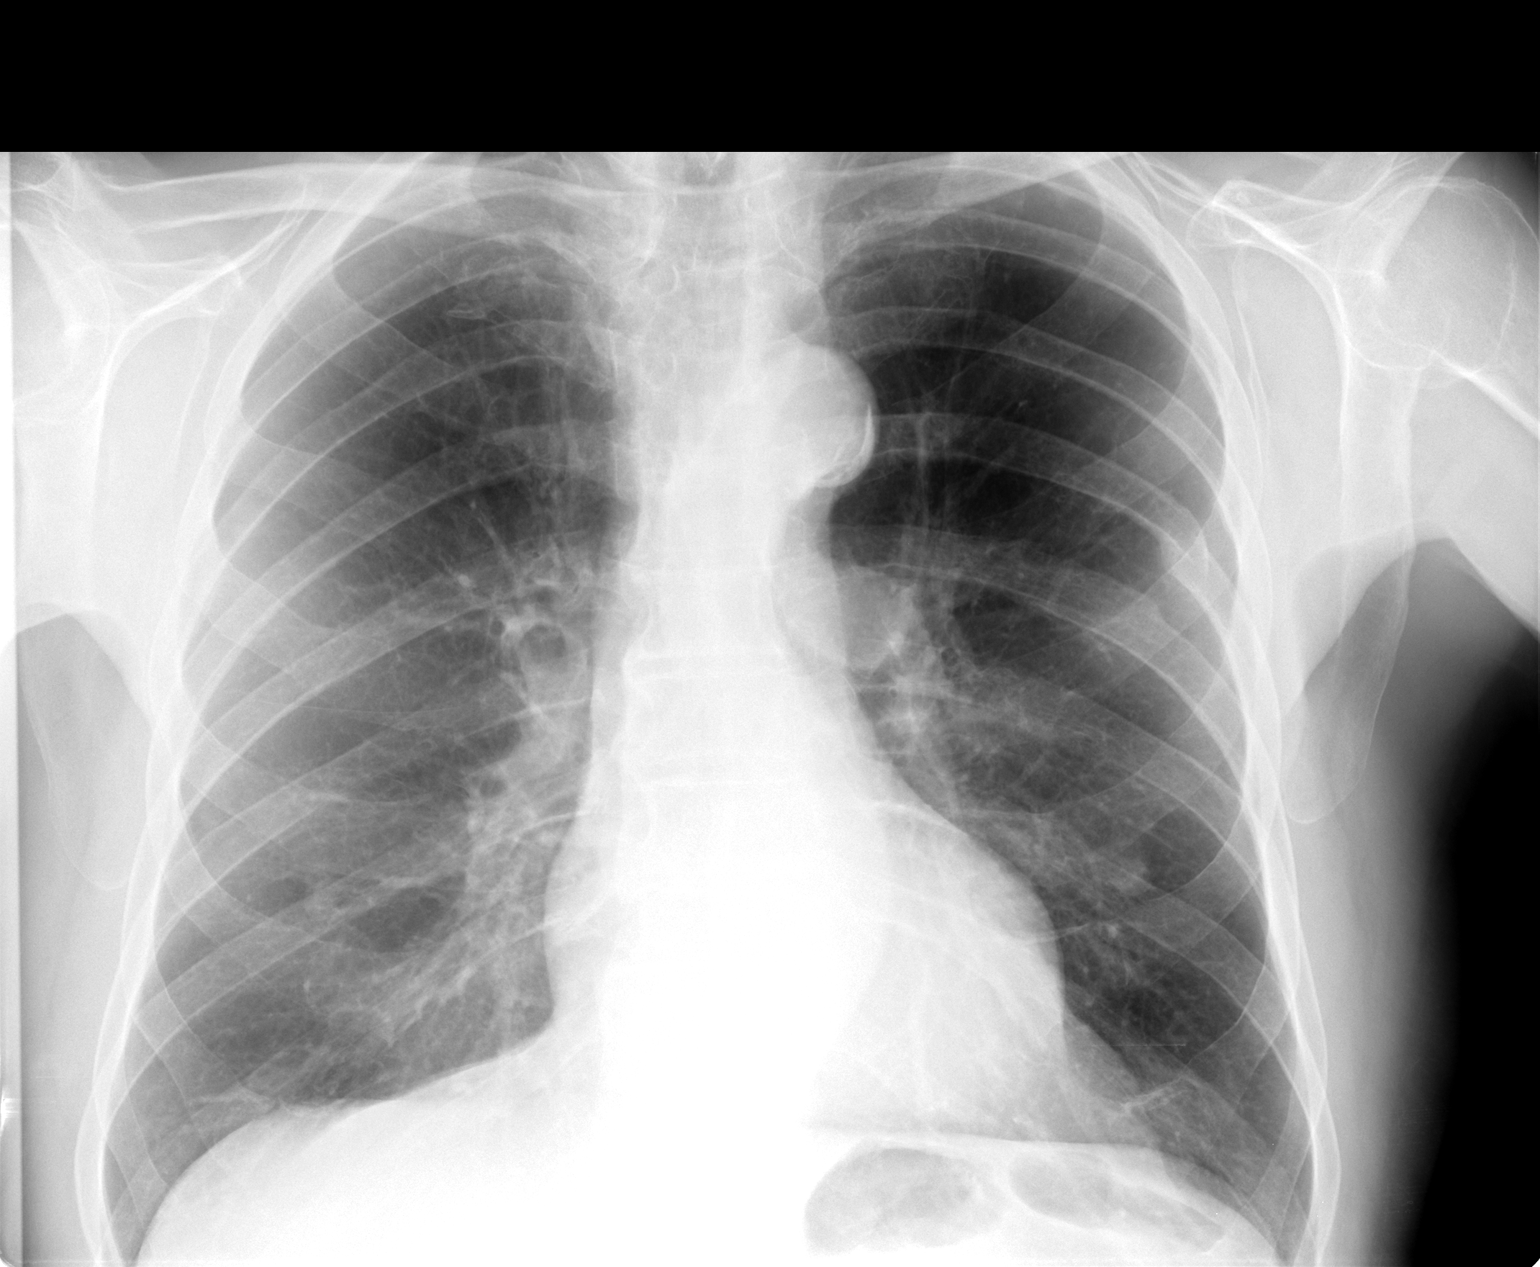

[view not recorded (2 of 2)]
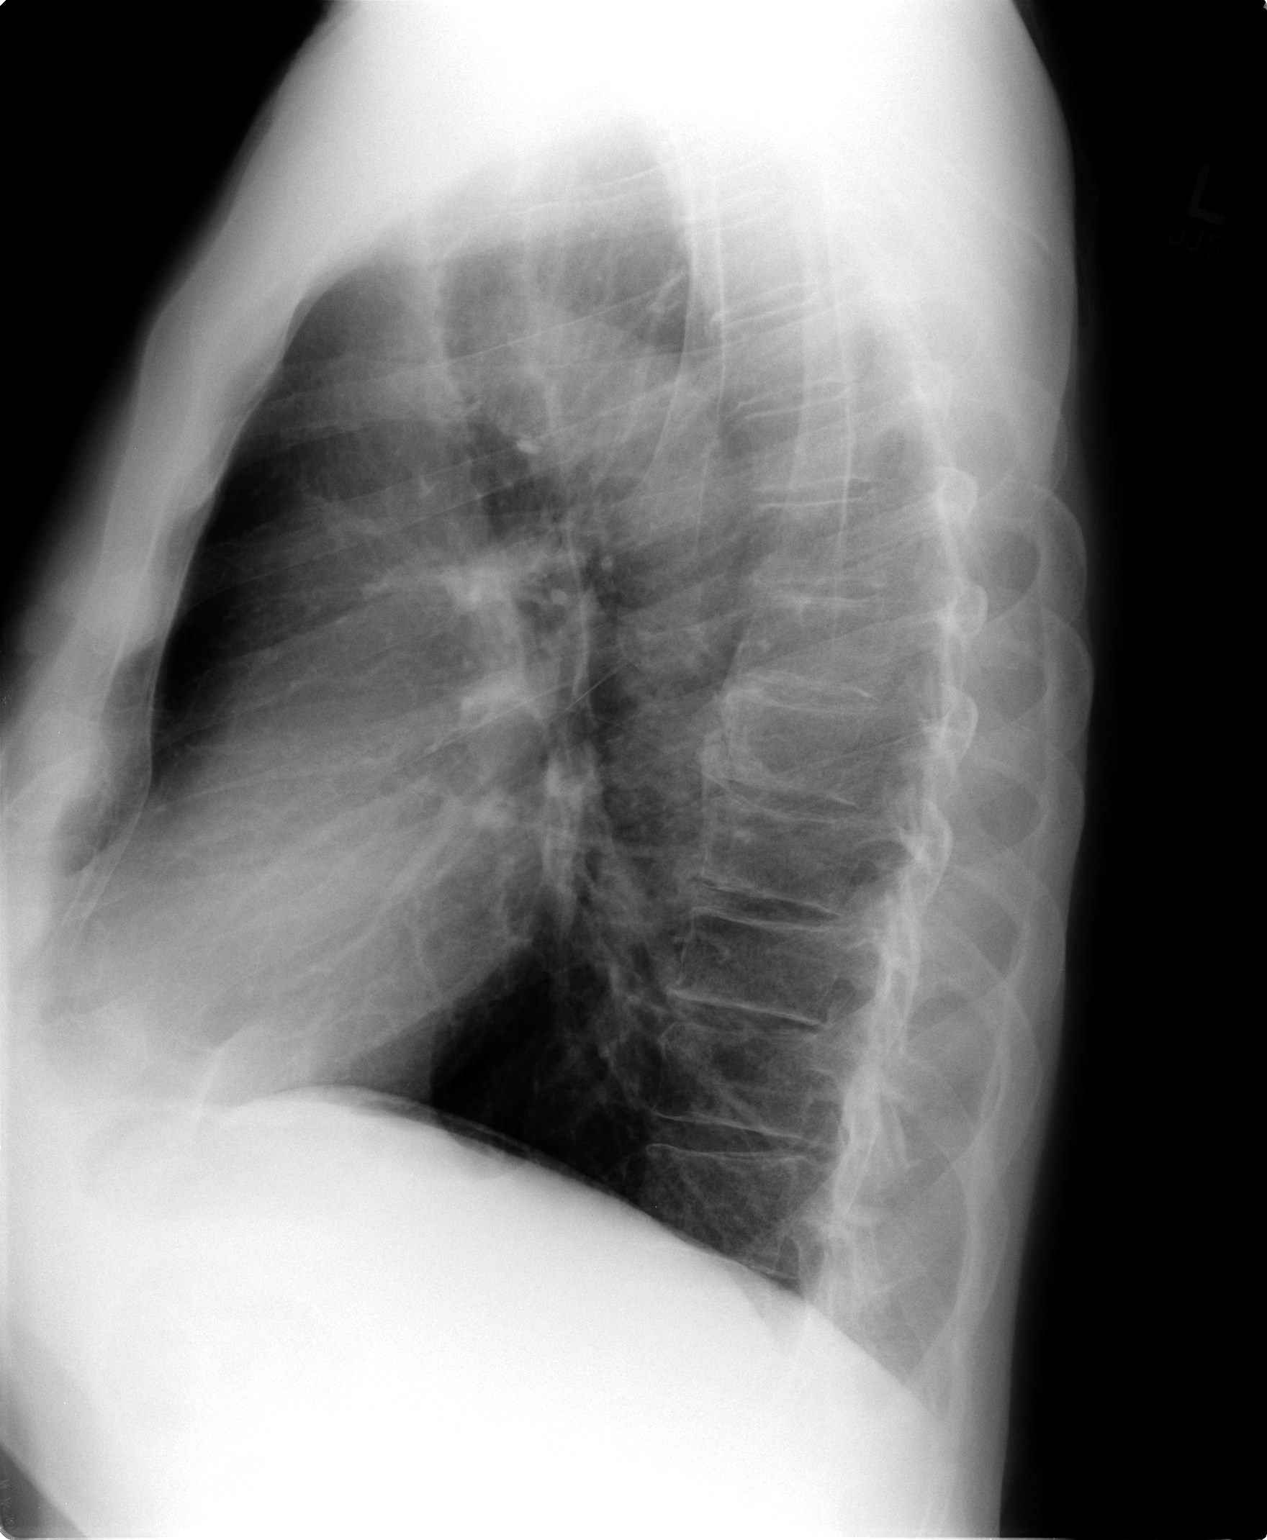

[2 of 2 positions shown; findings below may reference images not displayed]

FINDINGS: The cardiomediastinal silhouette is unremarkable.
The lungs are clear.
There is no evidence of focal airspace disease, pulmonary edema,
pleural effusion, or pneumothorax.
No acute bony abnormalities are identified.
Remote left-sided rib fractures are identified.
IMPRESSION: No evidence of active cardiopulmonary disease.

## 2012-07-31 ENCOUNTER — Other Ambulatory Visit: Payer: Self-pay

## 2012-07-31 DIAGNOSIS — G250 Essential tremor: Secondary | ICD-10-CM | POA: Diagnosis not present

## 2012-07-31 MED ORDER — PIOGLITAZONE HCL 30 MG PO TABS: ORAL_TABLET | ORAL | Status: DC

## 2012-07-31 MED ORDER — METOPROLOL SUCCINATE ER 100 MG PO TB24: ORAL_TABLET | ORAL | Status: DC

## 2012-08-07 ENCOUNTER — Ambulatory Visit: Payer: Medicare Other | Admitting: Endocrinology

## 2012-08-10 ENCOUNTER — Telehealth: Payer: Self-pay | Admitting: Endocrinology

## 2012-08-10 ENCOUNTER — Ambulatory Visit (INDEPENDENT_AMBULATORY_CARE_PROVIDER_SITE_OTHER): Payer: Medicare Other | Admitting: Endocrinology

## 2012-08-10 ENCOUNTER — Encounter: Payer: Self-pay | Admitting: Endocrinology

## 2012-08-10 VITALS — BP 126/74 | HR 78 | Ht 66.0 in | Wt 162.0 lb

## 2012-08-10 DIAGNOSIS — E876 Hypokalemia: Secondary | ICD-10-CM | POA: Diagnosis not present

## 2012-08-10 DIAGNOSIS — D649 Anemia, unspecified: Secondary | ICD-10-CM

## 2012-08-10 DIAGNOSIS — R413 Other amnesia: Secondary | ICD-10-CM

## 2012-08-10 DIAGNOSIS — R972 Elevated prostate specific antigen [PSA]: Secondary | ICD-10-CM

## 2012-08-10 DIAGNOSIS — E119 Type 2 diabetes mellitus without complications: Secondary | ICD-10-CM

## 2012-08-10 DIAGNOSIS — F0280 Dementia in other diseases classified elsewhere without behavioral disturbance: Secondary | ICD-10-CM

## 2012-08-10 DIAGNOSIS — Z0000 Encounter for general adult medical examination without abnormal findings: Secondary | ICD-10-CM | POA: Diagnosis not present

## 2012-08-10 DIAGNOSIS — I10 Essential (primary) hypertension: Secondary | ICD-10-CM | POA: Diagnosis not present

## 2012-08-10 DIAGNOSIS — G309 Alzheimer's disease, unspecified: Secondary | ICD-10-CM

## 2012-08-10 DIAGNOSIS — E1129 Type 2 diabetes mellitus with other diabetic kidney complication: Secondary | ICD-10-CM

## 2012-08-10 DIAGNOSIS — E785 Hyperlipidemia, unspecified: Secondary | ICD-10-CM

## 2012-08-10 DIAGNOSIS — Z79899 Other long term (current) drug therapy: Secondary | ICD-10-CM

## 2012-08-10 LAB — CBC WITH DIFFERENTIAL/PLATELET
Basophils Absolute: 0 10*3/uL (ref 0.0–0.1)
Basophils Relative: 0.2 % (ref 0.0–3.0)
Hemoglobin: 12.3 g/dL — ABNORMAL LOW (ref 13.0–17.0)
Lymphocytes Relative: 54.4 % — ABNORMAL HIGH (ref 12.0–46.0)
Monocytes Relative: 9.4 % (ref 3.0–12.0)
Neutro Abs: 4 10*3/uL (ref 1.4–7.7)
RBC: 4.01 Mil/uL — ABNORMAL LOW (ref 4.22–5.81)
WBC: 11.2 10*3/uL — ABNORMAL HIGH (ref 4.5–10.5)

## 2012-08-10 LAB — URINALYSIS, ROUTINE W REFLEX MICROSCOPIC
Bilirubin Urine: NEGATIVE
Leukocytes, UA: NEGATIVE
Nitrite: NEGATIVE
Specific Gravity, Urine: 1.01 (ref 1.000–1.030)
pH: 6 (ref 5.0–8.0)

## 2012-08-10 LAB — BASIC METABOLIC PANEL
CO2: 26 mEq/L (ref 19–32)
Calcium: 8.3 mg/dL — ABNORMAL LOW (ref 8.4–10.5)
GFR: 52.53 mL/min — ABNORMAL LOW (ref >60.00)
Sodium: 134 mEq/L — ABNORMAL LOW (ref 135–145)

## 2012-08-10 LAB — IBC PANEL: Saturation Ratios: 21.5 % (ref 20.0–50.0)

## 2012-08-10 LAB — MICROALBUMIN / CREATININE URINE RATIO
Creatinine,U: 74.4 mg/dL
Microalb Creat Ratio: 9.4 mg/g (ref 0.0–30.0)

## 2012-08-10 LAB — HEPATIC FUNCTION PANEL
Alkaline Phosphatase: 62 U/L (ref 39–117)
Bilirubin, Direct: 0.2 mg/dL (ref 0.0–0.3)
Total Bilirubin: 0.8 mg/dL (ref 0.3–1.2)
Total Protein: 6.6 g/dL (ref 6.0–8.3)

## 2012-08-10 LAB — LIPID PANEL
Cholesterol: 128 mg/dL (ref 0–200)
Total CHOL/HDL Ratio: 9

## 2012-08-10 NOTE — Telephone Encounter (Signed)
please call patient: [facility] says they do not offer this service. You should pursue placement, but we do not have a service available to guide you.

## 2012-08-10 NOTE — Patient Instructions (Addendum)
blood tests are being requested for you today.  We'll contact you with results.   Please come in for a "medicare wellness" appointment soon.   You will be contacted by "[facility] care management."

## 2012-08-10 NOTE — Progress Notes (Signed)
Subjective:    Patient ID: Alex Robinson, male    DOB: 1926/02/09, 79 y.o.   MRN: [id number]  HPI The state of at least three ongoing medical problems is addressed today, with interval history of each noted here: Tremor has worsened in the past few weeks.  He was seen at [facility], and was told no more could be done.   Dyslipidemia: he denies sob. Memory loss: appetite is poor.   Past Medical History  Diagnosis Date  . ALLERGIC RHINITIS 09/22/2006  . ANEMIA-NOS 09/22/2006  . CARCINOMA, SKIN, SQUAMOUS CELL 01/04/2009  . Cough 02/01/2008  . DIABETES MELLITUS, TYPE II 09/22/2006  . DIVERTICULITIS, HX OF 09/22/2006  . GERD 09/22/2006  . HYPERLIPIDEMIA 09/22/2006  . HYPERTENSION 09/22/2006  . Impacted cerumen 11/06/2007  . INGUINAL HERNIA, RIGHT 12/20/2008  . OSTEOARTHRITIS 11/06/2007  . PSA, INCREASED 11/06/2007  . RECTAL BLEEDING 04/04/2010  . TREMOR 04/23/2007    in hands  . URINARY RETENTION 04/04/2010  . Vitiligo 11/06/2007  . Asthma     as a child  . Neuromuscular disorder     tremor- hands    Past Surgical History  Procedure Laterality Date  . Appendectomy  1946  . Tonsillectomy and adenoidectomy  1939  . Inner ear surgery  1960  . Cataract extraction      History   Social History  . Marital Status: Widowed    Spouse Name: N/A    Number of Children: 3  . Years of Education: N/A   Occupational History  . Retired    Social History Main Topics  . Smoking status: Former Games developer  . Smokeless tobacco: Never Used     Comment: quit smoking at 20  . Alcohol Use: No  . Drug Use: No  . Sexually Active: Not on file   Other Topics Concern  . Not on file   Social History Narrative   Widowed 2008   Pt says he gets exercise.     Current Outpatient Prescriptions on File Prior to Visit  Medication Sig Dispense Refill  . amLODipine (NORVASC) 5 MG tablet TAKE 1 TABLET BY MOUTH EVERY DAY  90 tablet  3  . aspirin 81 MG tablet Take 81 mg by mouth daily.        [name] Kitchen atorvastatin (LIPITOR)  80 MG tablet TAKE 1 TABLET BY MOUTH EVERY DAY  90 tablet  3  . lisinopril-hydrochlorothiazide (PRINZIDE,ZESTORETIC) 20-12.5 MG per tablet Take 1 tablet by mouth daily.        . metoprolol succinate (TOPROL-XL) 100 MG 24 hr tablet TAKE 1/2 TABLET BY MOUTH ONCE A DAY  45 tablet  3  . Omega-3 Fatty Acids (FISH OIL) 1200 MG CAPS Take 1 capsule by mouth 2 (two) times daily.        . pioglitazone (ACTOS) 30 MG tablet TAKE 1 TABLET BY MOUTH DAILY  90 tablet  3  . Tamsulosin HCl (FLOMAX) 0.4 MG CAPS Take 0.4 mg by mouth 2 (two) times daily.        No current facility-administered medications on file prior to visit.    Allergies  Allergen Reactions  . Niacin Hives  . Sulfamethoxazole W-Trimethoprim Hives    Family History  Problem Relation Age of Onset  . Stroke Mother   . Heart attack Father   . Colon cancer Neg Hx   . Esophageal cancer Neg Hx   . Stomach cancer Neg Hx   . Rectal cancer Neg Hx     BP 126/74  Pulse 78  Ht 5\' 6"  (1.676 m)  Wt 162 lb (73.483 kg)  BMI 26.16 kg/m2  SpO2 98%  Review of Systems He has hearing loss and memory loss.      Objective:   Physical Exam VITAL SIGNS:  See vs page GENERAL: no distress PSYCH: Alert and oriented to self, place, and 08/14/12.  Does not appear anxious nor depressed.    Lab Results  Component Value Date   WBC 11.2* 08/10/2012   HGB 12.3* 08/10/2012   HCT 36.4* 08/10/2012   PLT 203.0 08/10/2012   GLUCOSE 110* 08/10/2012   CHOL 128 08/10/2012   TRIG 328.0* 08/10/2012   HDL 14.10* 08/10/2012   LDLDIRECT 49.3 08/10/2012   LDLCALC 73 04/09/2011   ALT 85* 08/10/2012   AST 117* 08/10/2012   NA 134* 08/10/2012   K 3.4* 08/10/2012   CL 97 08/10/2012   CREATININE 1.4 08/10/2012   BUN 31* 08/10/2012   CO2 26 08/10/2012   TSH 0.63 08/10/2012   PSA 3.11 04/09/2011   HGBA1C 6.7* 08/10/2012   MICROALBUR 7.0* 08/10/2012      Assessment & Plan:  Tremor, persistent Memory loss, worse DM: no med needed now Dyslipidemia: well-controlled

## 2012-08-11 ENCOUNTER — Telehealth: Payer: Self-pay

## 2012-08-11 NOTE — Telephone Encounter (Signed)
done

## 2012-08-11 NOTE — Telephone Encounter (Signed)
Pt's dtr states he needs nursing placement what is the service that you suggest?

## 2012-08-11 NOTE — Telephone Encounter (Signed)
Pleas schedule pt for medicare wellness visit per Dr. Everardo All

## 2012-08-11 NOTE — Telephone Encounter (Signed)
Pt's dtr advised

## 2012-08-11 NOTE — Telephone Encounter (Signed)
i would advise contacting an assisted living facility, and seeing what they have to offer you

## 2012-08-11 NOTE — Telephone Encounter (Signed)
Pt's dtr states she needs a referrral from you for in home nursing care to help with daily activities

## 2012-08-17 ENCOUNTER — Encounter: Payer: Self-pay | Admitting: Endocrinology

## 2012-08-17 ENCOUNTER — Other Ambulatory Visit: Payer: Self-pay | Admitting: *Deleted

## 2012-08-17 ENCOUNTER — Ambulatory Visit (INDEPENDENT_AMBULATORY_CARE_PROVIDER_SITE_OTHER): Payer: Medicare Other | Admitting: Endocrinology

## 2012-08-17 VITALS — BP 126/78 | HR 68 | Ht 66.0 in | Wt 157.0 lb

## 2012-08-17 DIAGNOSIS — G309 Alzheimer's disease, unspecified: Secondary | ICD-10-CM | POA: Diagnosis not present

## 2012-08-17 DIAGNOSIS — H9110 Presbycusis, unspecified ear: Secondary | ICD-10-CM | POA: Diagnosis not present

## 2012-08-17 DIAGNOSIS — Z0000 Encounter for general adult medical examination without abnormal findings: Secondary | ICD-10-CM

## 2012-08-17 DIAGNOSIS — F0280 Dementia in other diseases classified elsewhere without behavioral disturbance: Secondary | ICD-10-CM | POA: Diagnosis not present

## 2012-08-17 MED ORDER — AMLODIPINE BESYLATE 5 MG PO TABS: ORAL_TABLET | ORAL | Status: DC

## 2012-08-17 MED ORDER — POTASSIUM CHLORIDE ER 10 MEQ PO TBCR: 10.0000 meq | EXTENDED_RELEASE_TABLET | Freq: Every day | ORAL | Status: DC

## 2012-08-17 MED ORDER — DONEPEZIL HCL 5 MG PO TABS: 5.0000 mg | ORAL_TABLET | Freq: Every day | ORAL | Status: DC

## 2012-08-17 NOTE — Telephone Encounter (Signed)
rx refill

## 2012-08-17 NOTE — Patient Instructions (Addendum)
i have sent 2 prescription to your pharmacy: for the potassium, and the memory.   Please come back for a follow-up appointment in 3 months.   Refer to a hearing specialist.  you will receive a phone call, about a day and time for an appointment. please consider these measures for your health:  minimize alcohol.  do not use tobacco products.  have a colonoscopy at least every 10 years from age 79.  keep firearms safely stored.  always use seat belts.  have working smoke alarms in your home.  see an eye doctor and dentist regularly.  never drive under the influence of alcohol or drugs (including prescription drugs).  those with fair skin should take precautions against the sun. it is critically important to prevent falling down (keep floor areas well-lit, dry, and free of loose objects.  If you have a cane, walker, or wheelchair, you should use it, even for short trips around the house.  Also, try not to rush).   Please call [phone], to see a doctor about the nodule on your left ear

## 2012-08-17 NOTE — Progress Notes (Signed)
Subjective:    Patient ID: Alex Robinson, male    DOB: 14-Aug-1925, 79 y.o.   MRN: [id number]  HPI The state of at least three ongoing medical problems is addressed today, with interval history of each noted here: HTN: pt does not take zestoretic.  Denies sob. Hypokalemia: he denies cramps.  DM: no cbg record, but states cbg's are well-controlled.  Denies edema.   Past Medical History  Diagnosis Date  . ALLERGIC RHINITIS 09/22/2006  . ANEMIA-NOS 09/22/2006  . CARCINOMA, SKIN, SQUAMOUS CELL 01/04/2009  . Cough 02/01/2008  . DIABETES MELLITUS, TYPE II 09/22/2006  . DIVERTICULITIS, HX OF 09/22/2006  . GERD 09/22/2006  . HYPERLIPIDEMIA 09/22/2006  . HYPERTENSION 09/22/2006  . Impacted cerumen 11/06/2007  . INGUINAL HERNIA, RIGHT 12/20/2008  . OSTEOARTHRITIS 11/06/2007  . PSA, INCREASED 11/06/2007  . RECTAL BLEEDING 04/04/2010  . TREMOR 04/23/2007    in hands  . URINARY RETENTION 04/04/2010  . Vitiligo 11/06/2007  . Asthma     as a child  . Neuromuscular disorder     tremor- hands    Past Surgical History  Procedure Laterality Date  . Appendectomy  1946  . Tonsillectomy and adenoidectomy  1939  . Inner ear surgery  1960  . Cataract extraction      History   Social History  . Marital Status: Widowed    Spouse Name: N/A    Number of Children: 3  . Years of Education: N/A   Occupational History  . Retired    Social History Main Topics  . Smoking status: Former Games developer  . Smokeless tobacco: Never Used     Comment: quit smoking at 20  . Alcohol Use: No  . Drug Use: No  . Sexually Active: Not on file   Other Topics Concern  . Not on file   Social History Narrative   Widowed 2008   Pt says he gets exercise.     Current Outpatient Prescriptions on File Prior to Visit  Medication Sig Dispense Refill  . aspirin 81 MG tablet Take 81 mg by mouth daily.        [name] Kitchen atorvastatin (LIPITOR) 80 MG tablet TAKE 1 TABLET BY MOUTH EVERY DAY  90 tablet  3  . metoprolol succinate (TOPROL-XL)  100 MG 24 hr tablet TAKE 1/2 TABLET BY MOUTH ONCE A DAY  45 tablet  3  . Omega-3 Fatty Acids (FISH OIL) 1200 MG CAPS Take 1 capsule by mouth 2 (two) times daily.        . pioglitazone (ACTOS) 30 MG tablet TAKE 1 TABLET BY MOUTH DAILY  90 tablet  3  . Tamsulosin HCl (FLOMAX) 0.4 MG CAPS Take 0.4 mg by mouth 2 (two) times daily.        No current facility-administered medications on file prior to visit.    Allergies  Allergen Reactions  . Niacin Hives  . Sulfamethoxazole W-Trimethoprim Hives    Family History  Problem Relation Age of Onset  . Stroke Mother   . Heart attack Father   . Colon cancer Neg Hx   . Esophageal cancer Neg Hx   . Stomach cancer Neg Hx   . Rectal cancer Neg Hx     BP 126/78  Pulse 68  Ht 5\' 6"  (1.676 m)  Wt 157 lb (71.215 kg)  BMI 25.35 kg/m2  SpO2 98%  Review of Systems Denies weight change.  Memory loss persists.    Objective:   Physical Exam Vital signs: see vs page  Gen: elderly, frail, no distress Both eac's and tm's are normal Left external ear: large AK Orientation to time 5 From broadest to most narrow (3/5) Orientation to place 5 From broadest to most narrow (4/5) Registration 3 Repeating named prompts (2/3) Attention and calculation 5 spelling "world" backwards (4/5) Recall 3 Registration recall (0/3) Language 2 Naming a pencil and a watch (2/2) Repetition 1 Speaking back a phrase. (1/1) Complex commands 6 draw figure.  (6/6) Total 22/30.    Lab Results  Component Value Date   WBC 11.2* 08/10/2012   HGB 12.3* 08/10/2012   HCT 36.4* 08/10/2012   PLT 203.0 08/10/2012   GLUCOSE 110* 08/10/2012   CHOL 128 08/10/2012   TRIG 328.0* 08/10/2012   HDL 14.10* 08/10/2012   LDLDIRECT 49.3 08/10/2012   LDLCALC 73 04/09/2011   ALT 85* 08/10/2012   AST 117* 08/10/2012   NA 134* 08/10/2012   K 3.4* 08/10/2012   CL 97 08/10/2012   CREATININE 1.4 08/10/2012   BUN 31* 08/10/2012   CO2 26 08/10/2012   TSH 0.63 08/10/2012   PSA 3.11 04/09/2011   HGBA1C 6.7*  08/10/2012   MICROALBUR 7.0* 08/10/2012      Assessment & Plan:  Hypokalemia, possibly due to zestoretic HTN, well-controlled Dementia: worse DM: no med is needed Actinic keratosis.  He should have removal Hearing loss: he needs rx  Subjective:   Patient here for Medicare annual wellness visit and management of other chronic and acute problems.     Risk factors: advanced age    Roster of Physicians Providing Medical Care to Patient:  See "snapshot"   Activities of Daily Living: In your present state of health, do you have any difficulty performing the following activities?:  Preparing food and eating?: yes Bathing yourself: No  Getting dressed: sometimes  Using the toilet: No  Moving around from place to place: No  In the past year have you fallen or had a near fall?: No    Home Safety: Has smoke detector and wears seat belts. No firearms. No excess sun exposure.  Diet and Exercise  Current exercise habits: severely limits by health probs Dietary issues discussed: pt reports a healthy diet   Depression Screen  Q1: Over the past two weeks, have you felt down, depressed or hopeless? no  Q2: Over the past two weeks, have you felt little interest or pleasure in doing things? no   The following portions of the patient's history were reviewed and updated as appropriate: allergies, current medications, past family history, past medical history, past social history, past surgical history and problem list.  Past Medical History  Diagnosis Date  . ALLERGIC RHINITIS 09/22/2006  . ANEMIA-NOS 09/22/2006  . CARCINOMA, SKIN, SQUAMOUS CELL 01/04/2009  . Cough 02/01/2008  . DIABETES MELLITUS, TYPE II 09/22/2006  . DIVERTICULITIS, HX OF 09/22/2006  . GERD 09/22/2006  . HYPERLIPIDEMIA 09/22/2006  . HYPERTENSION 09/22/2006  . Impacted cerumen 11/06/2007  . INGUINAL HERNIA, RIGHT 12/20/2008  . OSTEOARTHRITIS 11/06/2007  . PSA, INCREASED 11/06/2007  . RECTAL BLEEDING 04/04/2010  . TREMOR 04/23/2007     in hands  . URINARY RETENTION 04/04/2010  . Vitiligo 11/06/2007  . Asthma     as a child  . Neuromuscular disorder     tremor- hands    Past Surgical History  Procedure Laterality Date  . Appendectomy  1946  . Tonsillectomy and adenoidectomy  1939  . Inner ear surgery  1960  . Cataract extraction  History   Social History  . Marital Status: Widowed    Spouse Name: N/A    Number of Children: 3  . Years of Education: N/A   Occupational History  . Retired    Social History Main Topics  . Smoking status: Former Games developer  . Smokeless tobacco: Never Used     Comment: quit smoking at 20  . Alcohol Use: No  . Drug Use: No  . Sexually Active: Not on file   Other Topics Concern  . Not on file   Social History Narrative   Widowed 2008   Pt says he gets exercise.     Current Outpatient Prescriptions on File Prior to Visit  Medication Sig Dispense Refill  . aspirin 81 MG tablet Take 81 mg by mouth daily.        [name] Kitchen atorvastatin (LIPITOR) 80 MG tablet TAKE 1 TABLET BY MOUTH EVERY DAY  90 tablet  3  . metoprolol succinate (TOPROL-XL) 100 MG 24 hr tablet TAKE 1/2 TABLET BY MOUTH ONCE A DAY  45 tablet  3  . Omega-3 Fatty Acids (FISH OIL) 1200 MG CAPS Take 1 capsule by mouth 2 (two) times daily.        . pioglitazone (ACTOS) 30 MG tablet TAKE 1 TABLET BY MOUTH DAILY  90 tablet  3  . Tamsulosin HCl (FLOMAX) 0.4 MG CAPS Take 0.4 mg by mouth 2 (two) times daily.        No current facility-administered medications on file prior to visit.    Allergies  Allergen Reactions  . Niacin Hives  . Sulfamethoxazole W-Trimethoprim Hives    Family History  Problem Relation Age of Onset  . Stroke Mother   . Heart attack Father   . Colon cancer Neg Hx   . Esophageal cancer Neg Hx   . Stomach cancer Neg Hx   . Rectal cancer Neg Hx     BP 126/78  Pulse 68  Ht 5\' 6"  (1.676 m)  Wt 157 lb (71.215 kg)  BMI 25.35 kg/m2  SpO2 98%  Review of Systems  Denies visual loss.  He has  chronic hearing loss. Objective:   Vision:  Sees opthalmologist Hearing: grossly normal Body mass index:  See vs page Msk: pt easily and quickly performs "get-up-and-go" from a sitting position Cognitive Impairment Assessment: cognition, memory and judgment appear normal.  remembers 0/3 at 5 minutes.  por recall.  can easily read and write a sentence.  alert and oriented x 2.     Assessment:   Medicare wellness utd on preventive parameters.      Plan:   During the course of the visit the patient was educated and counseled about appropriate screening and preventive services including:        Fall prevention    Diabetes screening  Nutrition counseling   Vaccines / LABS Zostavax / Pnemonccoal Vaccine  today  PSA  Patient Instructions (the written plan) was given to the patient.   we discussed code status.  pt requests full code, but would not want to be started or maintained on artificial life-support measures if there was not a reasonable chance of recovery

## 2012-09-01 DIAGNOSIS — H905 Unspecified sensorineural hearing loss: Secondary | ICD-10-CM | POA: Diagnosis not present

## 2012-09-01 DIAGNOSIS — H903 Sensorineural hearing loss, bilateral: Secondary | ICD-10-CM | POA: Diagnosis not present

## 2012-09-14 ENCOUNTER — Other Ambulatory Visit: Payer: Self-pay | Admitting: *Deleted

## 2012-09-14 MED ORDER — DONEPEZIL HCL 5 MG PO TABS: 5.0000 mg | ORAL_TABLET | Freq: Every day | ORAL | Status: DC

## 2012-09-14 NOTE — Telephone Encounter (Signed)
Insurance requires 90-day supply, resent Rx to pharmacy/SLS

## 2012-09-21 ENCOUNTER — Telehealth: Payer: Self-pay

## 2012-09-21 NOTE — Telephone Encounter (Signed)
Stomped toe on Saturday, no pus discharge, toenail is broken and yellowish

## 2012-09-21 NOTE — Telephone Encounter (Signed)
Pt's dtr called pt stomped toe, swollen and painful, also toe nail broken and yellowish, please advise [phone]

## 2012-09-21 NOTE — Telephone Encounter (Signed)
If there is any redness especially on the base of the broken toenail I would like to see the patient Otherwise just put Neosporin on the exposed nail bed  Would recommend seeing a foot doctor if the pain is not better in a day or 2

## 2012-09-21 NOTE — Telephone Encounter (Signed)
When did this happen?  Is the yellowness from a pus discharge?  Does the patient have a primary care physician, should be going to his PCP for this

## 2012-09-24 NOTE — Telephone Encounter (Signed)
Left message x's 2 pt to call if any questions

## 2012-10-05 ENCOUNTER — Other Ambulatory Visit: Payer: Self-pay | Admitting: *Deleted

## 2012-10-05 MED ORDER — ATORVASTATIN CALCIUM 80 MG PO TABS: ORAL_TABLET | ORAL | Status: DC

## 2012-11-03 ENCOUNTER — Ambulatory Visit: Payer: Medicare Other | Admitting: Physician Assistant

## 2013-01-14 ENCOUNTER — Telehealth: Payer: Self-pay | Admitting: *Deleted

## 2013-01-14 NOTE — Telephone Encounter (Signed)
Notified patient that his office visit/follow up was overdue & that Dr. Everardo All would address is [facility] forms at that time. Transferred patient to reception to schedule his appointment.

## 2013-01-15 ENCOUNTER — Ambulatory Visit: Payer: Medicare Other | Admitting: Endocrinology

## 2013-01-25 ENCOUNTER — Encounter: Payer: Self-pay | Admitting: Endocrinology

## 2013-01-25 ENCOUNTER — Telehealth: Payer: Self-pay | Admitting: Endocrinology

## 2013-01-25 ENCOUNTER — Ambulatory Visit (INDEPENDENT_AMBULATORY_CARE_PROVIDER_SITE_OTHER): Payer: Medicare Other | Admitting: Endocrinology

## 2013-01-25 VITALS — BP 130/80 | HR 65 | Temp 98.0°F | Ht 66.0 in | Wt 166.5 lb

## 2013-01-25 DIAGNOSIS — E119 Type 2 diabetes mellitus without complications: Secondary | ICD-10-CM

## 2013-01-25 DIAGNOSIS — E1129 Type 2 diabetes mellitus with other diabetic kidney complication: Secondary | ICD-10-CM

## 2013-01-25 DIAGNOSIS — Z79899 Other long term (current) drug therapy: Secondary | ICD-10-CM

## 2013-01-25 DIAGNOSIS — D649 Anemia, unspecified: Secondary | ICD-10-CM | POA: Diagnosis not present

## 2013-01-25 DIAGNOSIS — R259 Unspecified abnormal involuntary movements: Secondary | ICD-10-CM

## 2013-01-25 LAB — BASIC METABOLIC PANEL
CO2: 27 mEq/L (ref 19–32)
Chloride: 109 mEq/L (ref 96–112)
Glucose, Bld: 85 mg/dL (ref 70–99)
Potassium: 4.2 mEq/L (ref 3.5–5.1)
Sodium: 143 mEq/L (ref 135–145)

## 2013-01-25 LAB — HEPATIC FUNCTION PANEL
ALT: 25 U/L (ref 0–53)
AST: 29 U/L (ref 0–37)
Albumin: 3.8 g/dL (ref 3.5–5.2)
Alkaline Phosphatase: 55 U/L (ref 39–117)
Bilirubin, Direct: 0 mg/dL (ref 0.0–0.3)
Total Bilirubin: 0.4 mg/dL (ref 0.3–1.2)

## 2013-01-25 LAB — CBC WITH DIFFERENTIAL/PLATELET
Basophils Relative: 0.7 % (ref 0.0–3.0)
Eosinophils Relative: 9.2 % — ABNORMAL HIGH (ref 0.0–5.0)
HCT: 39 % (ref 39.0–52.0)
Hemoglobin: 12.9 g/dL — ABNORMAL LOW (ref 13.0–17.0)
Lymphocytes Relative: 31 % (ref 12.0–46.0)
Lymphs Abs: 2.4 10*3/uL (ref 0.7–4.0)
Monocytes Relative: 10.1 % (ref 3.0–12.0)
Neutro Abs: 3.8 10*3/uL (ref 1.4–7.7)
RBC: 4.23 Mil/uL (ref 4.22–5.81)
WBC: 7.8 10*3/uL (ref 4.5–10.5)

## 2013-01-25 MED ORDER — ONETOUCH ULTRA SYSTEM W/DEVICE KIT: 1.0000 | PACK | Freq: Once | Status: AC

## 2013-01-25 MED ORDER — GLUCOSE BLOOD VI STRP: 1.0000 | ORAL_STRIP | Freq: Every day | Status: AC

## 2013-01-25 MED ORDER — TERBINAFINE HCL 250 MG PO TABS: 250.0000 mg | ORAL_TABLET | Freq: Every day | ORAL | Status: DC

## 2013-01-25 NOTE — Telephone Encounter (Signed)
please call patient: i have sent a prescription to your pharmacy, for a new meter and strips

## 2013-01-25 NOTE — Patient Instructions (Addendum)
i have sent a prescription to your pharmacy, for the toenail fungus. blood tests are being requested for you today.  We'll contact you with results. Refer to a tremor specialist.  you will receive a phone call, about a day and time for an appointment Please come back for a "medicare wellness" appointment in 6 months (must be after 08/17/13).

## 2013-01-25 NOTE — Telephone Encounter (Signed)
Called and spoke with pt's daughter in law, advised her that Dr Everardo All sent in an rx for pt a new meter and strips. She understood.

## 2013-01-25 NOTE — Progress Notes (Signed)
Subjective:    Patient ID: Alex Robinson, male    DOB: Apr 15, 1925, 79 y.o.   MRN: [id number]  HPI Pt returns for f/u of type 2 DM (dx'ed 2007; complicated by nephropathy).   he brings a record of his cbg's which i have reviewed today.  It varies from 89-155.  There is no trend throughout the day.   Pt states 1 year of slight worsening of the chronic tremor of the hands.  No assoc anxiety.  Past Medical History  Diagnosis Date  . ALLERGIC RHINITIS 09/22/2006  . ANEMIA-NOS 09/22/2006  . CARCINOMA, SKIN, SQUAMOUS CELL 01/04/2009  . Cough 02/01/2008  . DIABETES MELLITUS, TYPE II 09/22/2006  . DIVERTICULITIS, HX OF 09/22/2006  . GERD 09/22/2006  . HYPERLIPIDEMIA 09/22/2006  . HYPERTENSION 09/22/2006  . Impacted cerumen 11/06/2007  . INGUINAL HERNIA, RIGHT 12/20/2008  . OSTEOARTHRITIS 11/06/2007  . PSA, INCREASED 11/06/2007  . RECTAL BLEEDING 04/04/2010  . TREMOR 04/23/2007    in hands  . URINARY RETENTION 04/04/2010  . Vitiligo 11/06/2007  . Asthma     as a child  . Neuromuscular disorder     tremor- hands    Past Surgical History  Procedure Laterality Date  . Appendectomy  1946  . Tonsillectomy and adenoidectomy  1939  . Inner ear surgery  1960  . Cataract extraction      History   Social History  . Marital Status: Widowed    Spouse Name: N/A    Number of Children: 3  . Years of Education: N/A   Occupational History  . Retired    Social History Main Topics  . Smoking status: Former Games developer  . Smokeless tobacco: Never Used     Comment: quit smoking at 20  . Alcohol Use: No  . Drug Use: No  . Sexual Activity: Not on file   Other Topics Concern  . Not on file   Social History Narrative   Widowed 2008   Pt says he gets exercise.     Current Outpatient Prescriptions on File Prior to Visit  Medication Sig Dispense Refill  . amLODipine (NORVASC) 5 MG tablet TAKE 1 TABLET BY MOUTH EVERY DAY  90 tablet  2  . aspirin 81 MG tablet Take 81 mg by mouth daily.        [name] Kitchen  atorvastatin (LIPITOR) 80 MG tablet TAKE 1 TABLET BY MOUTH EVERY DAY  90 tablet  3  . donepezil (ARICEPT) 5 MG tablet Take 1 tablet (5 mg total) by mouth at bedtime.  90 tablet  3  . metoprolol succinate (TOPROL-XL) 100 MG 24 hr tablet TAKE 1/2 TABLET BY MOUTH ONCE A DAY  45 tablet  3  . Omega-3 Fatty Acids (FISH OIL) 1200 MG CAPS Take 1 capsule by mouth 2 (two) times daily.        . pioglitazone (ACTOS) 30 MG tablet TAKE 1 TABLET BY MOUTH DAILY  90 tablet  3  . potassium chloride (K-DUR) 10 MEQ tablet Take 1 tablet (10 mEq total) by mouth daily.  30 tablet  11  . Tamsulosin HCl (FLOMAX) 0.4 MG CAPS Take 0.4 mg by mouth 2 (two) times daily.        No current facility-administered medications on file prior to visit.    Allergies  Allergen Reactions  . Niacin Hives  . Sulfamethoxazole-Trimethoprim Hives    Family History  Problem Relation Age of Onset  . Stroke Mother   . Heart attack Father   .  Colon cancer Neg Hx   . Esophageal cancer Neg Hx   . Stomach cancer Neg Hx   . Rectal cancer Neg Hx     BP 130/80  Pulse 65  Temp(Src) 98 F (36.7 C)  Ht 5\' 6"  (1.676 m)  Wt 166 lb 8 oz (75.524 kg)  BMI 26.89 kg/m2  SpO2 90%   Review of Systems denies hypoglycemia.  He has gained weight    Objective:   Physical Exam Vital signs: see vs page.   Gen: elderly, frail, no distress.   Neuro: moderate coarse tremor of the hands.     Lab Results  Component Value Date   HGBA1C 6.1 01/25/2013       Assessment & Plan:  DM: well-controlled Tremor, persistent

## 2013-04-17 ENCOUNTER — Other Ambulatory Visit: Payer: Self-pay | Admitting: Endocrinology

## 2013-05-03 ENCOUNTER — Telehealth: Payer: Self-pay | Admitting: Endocrinology

## 2013-05-03 NOTE — Telephone Encounter (Signed)
Done

## 2013-05-03 NOTE — Telephone Encounter (Signed)
Verify that the testing frequency is still accurate for the testing strip refill- is it still 1-2 times daily

## 2013-05-21 ENCOUNTER — Other Ambulatory Visit: Payer: Self-pay

## 2013-05-21 MED ORDER — AMLODIPINE BESYLATE 5 MG PO TABS: ORAL_TABLET | ORAL | Status: DC

## 2013-07-19 ENCOUNTER — Other Ambulatory Visit: Payer: Self-pay | Admitting: Endocrinology

## 2013-08-13 ENCOUNTER — Other Ambulatory Visit: Payer: Self-pay | Admitting: *Deleted

## 2013-08-13 MED ORDER — PIOGLITAZONE HCL 30 MG PO TABS: ORAL_TABLET | ORAL | Status: DC

## 2013-08-17 ENCOUNTER — Ambulatory Visit: Payer: Medicare Other | Admitting: Endocrinology

## 2013-08-19 ENCOUNTER — Other Ambulatory Visit: Payer: Self-pay | Admitting: Endocrinology

## 2013-09-21 ENCOUNTER — Other Ambulatory Visit: Payer: Self-pay

## 2013-09-21 MED ORDER — METOPROLOL SUCCINATE ER 100 MG PO TB24: ORAL_TABLET | ORAL | Status: DC

## 2013-10-21 ENCOUNTER — Other Ambulatory Visit: Payer: Self-pay | Admitting: Endocrinology

## 2013-10-22 ENCOUNTER — Other Ambulatory Visit: Payer: Self-pay | Admitting: Endocrinology

## 2013-10-23 ENCOUNTER — Other Ambulatory Visit: Payer: Self-pay | Admitting: Endocrinology

## 2013-12-04 ENCOUNTER — Other Ambulatory Visit: Payer: Self-pay | Admitting: Endocrinology

## 2013-12-06 NOTE — Telephone Encounter (Signed)
Rx sent to pharmacy   

## 2013-12-06 NOTE — Telephone Encounter (Signed)
Please refill x 1  "medicare wellness" appointment is due

## 2013-12-07 ENCOUNTER — Telehealth: Payer: Self-pay | Admitting: Endocrinology

## 2013-12-07 MED ORDER — AMLODIPINE BESYLATE 5 MG PO TABS: ORAL_TABLET | ORAL | Status: DC

## 2013-12-07 NOTE — Telephone Encounter (Signed)
Patient would like a refill on his blood pressure medication   Appointment is scheduled for 12/09/13   Thank you

## 2013-12-07 NOTE — Telephone Encounter (Signed)
Rx sent to pharmacy   

## 2013-12-09 ENCOUNTER — Ambulatory Visit (INDEPENDENT_AMBULATORY_CARE_PROVIDER_SITE_OTHER): Payer: Medicare Other | Admitting: Endocrinology

## 2013-12-09 ENCOUNTER — Encounter: Payer: Self-pay | Admitting: Endocrinology

## 2013-12-09 VITALS — BP 114/64 | HR 54 | Temp 97.9°F | Ht 66.0 in | Wt 160.0 lb

## 2013-12-09 DIAGNOSIS — E1122 Type 2 diabetes mellitus with diabetic chronic kidney disease: Secondary | ICD-10-CM

## 2013-12-09 DIAGNOSIS — N189 Chronic kidney disease, unspecified: Secondary | ICD-10-CM | POA: Diagnosis not present

## 2013-12-09 DIAGNOSIS — R972 Elevated prostate specific antigen [PSA]: Secondary | ICD-10-CM

## 2013-12-09 DIAGNOSIS — Z23 Encounter for immunization: Secondary | ICD-10-CM | POA: Diagnosis not present

## 2013-12-09 DIAGNOSIS — E785 Hyperlipidemia, unspecified: Secondary | ICD-10-CM | POA: Diagnosis not present

## 2013-12-09 DIAGNOSIS — Z0000 Encounter for general adult medical examination without abnormal findings: Secondary | ICD-10-CM

## 2013-12-09 DIAGNOSIS — D649 Anemia, unspecified: Secondary | ICD-10-CM

## 2013-12-09 DIAGNOSIS — I10 Essential (primary) hypertension: Secondary | ICD-10-CM

## 2013-12-09 LAB — HEMOGLOBIN A1C: Hgb A1c MFr Bld: 6.1 % (ref 4.6–6.5)

## 2013-12-09 LAB — MICROALBUMIN / CREATININE URINE RATIO
Creatinine,U: 170.2 mg/dL
MICROALB UR: 30.5 mg/dL — AB (ref 0.0–1.9)
Microalb Creat Ratio: 17.9 mg/g (ref 0.0–30.0)

## 2013-12-09 LAB — CBC WITH DIFFERENTIAL/PLATELET
BASOS PCT: 0.6 % (ref 0.0–3.0)
Basophils Absolute: 0 10*3/uL (ref 0.0–0.1)
Eosinophils Absolute: 0.6 10*3/uL (ref 0.0–0.7)
Eosinophils Relative: 8.4 % — ABNORMAL HIGH (ref 0.0–5.0)
HCT: 41.3 % (ref 39.0–52.0)
Hemoglobin: 13.2 g/dL (ref 13.0–17.0)
LYMPHS ABS: 1.6 10*3/uL (ref 0.7–4.0)
Lymphocytes Relative: 24.6 % (ref 12.0–46.0)
MCHC: 32 g/dL (ref 30.0–36.0)
MCV: 97.5 fl (ref 78.0–100.0)
MONO ABS: 0.6 10*3/uL (ref 0.1–1.0)
Monocytes Relative: 8.8 % (ref 3.0–12.0)
Neutro Abs: 3.8 10*3/uL (ref 1.4–7.7)
Neutrophils Relative %: 57.6 % (ref 43.0–77.0)
Platelets: 164 10*3/uL (ref 150.0–400.0)
RBC: 4.24 Mil/uL (ref 4.22–5.81)
RDW: 14.8 % (ref 11.5–15.5)
WBC: 6.6 10*3/uL (ref 4.0–10.5)

## 2013-12-09 LAB — HEPATIC FUNCTION PANEL
ALK PHOS: 54 U/L (ref 39–117)
ALT: 29 U/L (ref 0–53)
AST: 25 U/L (ref 0–37)
Albumin: 3.4 g/dL — ABNORMAL LOW (ref 3.5–5.2)
Bilirubin, Direct: 0.1 mg/dL (ref 0.0–0.3)
Total Bilirubin: 0.5 mg/dL (ref 0.2–1.2)
Total Protein: 6.4 g/dL (ref 6.0–8.3)

## 2013-12-09 LAB — BASIC METABOLIC PANEL
BUN: 29 mg/dL — ABNORMAL HIGH (ref 6–23)
CO2: 25 meq/L (ref 19–32)
Calcium: 9.1 mg/dL (ref 8.4–10.5)
Chloride: 104 mEq/L (ref 96–112)
Creatinine, Ser: 1.6 mg/dL — ABNORMAL HIGH (ref 0.4–1.5)
GFR: 45.41 mL/min — AB (ref >60.00)
Glucose, Bld: 66 mg/dL — ABNORMAL LOW (ref 70–99)
POTASSIUM: 4.8 meq/L (ref 3.5–5.1)
Sodium: 139 mEq/L (ref 135–145)

## 2013-12-09 LAB — LIPID PANEL
CHOL/HDL RATIO: 5
Cholesterol: 136 mg/dL (ref 0–200)
HDL: 29 mg/dL — AB (ref >39.00)
LDL CALC: 82 mg/dL (ref 0–99)
NonHDL: 107
TRIGLYCERIDES: 126 mg/dL (ref 0.0–149.0)
VLDL: 25.2 mg/dL (ref 0.0–40.0)

## 2013-12-09 LAB — URINALYSIS, ROUTINE W REFLEX MICROSCOPIC
BILIRUBIN URINE: NEGATIVE
Hgb urine dipstick: NEGATIVE
KETONES UR: NEGATIVE
Leukocytes, UA: NEGATIVE
Nitrite: NEGATIVE
Specific Gravity, Urine: 1.025 (ref 1.000–1.030)
Total Protein, Urine: 30 — AB
URINE GLUCOSE: NEGATIVE
Urobilinogen, UA: 0.2 (ref 0.0–1.0)
pH: 5.5 (ref 5.0–8.0)

## 2013-12-09 LAB — PSA: PSA: 2.75 ng/mL (ref 0.10–4.00)

## 2013-12-09 LAB — IBC PANEL
Iron: 97 ug/dL (ref 42–165)
Saturation Ratios: 35.9 % (ref 20.0–50.0)
Transferrin: 192.9 mg/dL — ABNORMAL LOW (ref 212.0–360.0)

## 2013-12-09 LAB — TSH: TSH: 1.1 u[IU]/mL (ref 0.35–4.50)

## 2013-12-09 MED ORDER — DONEPEZIL HCL 5 MG PO TABS: 5.0000 mg | ORAL_TABLET | Freq: Every day | ORAL | Status: DC

## 2013-12-09 MED ORDER — METOPROLOL SUCCINATE ER 100 MG PO TB24: ORAL_TABLET | ORAL | Status: DC

## 2013-12-09 MED ORDER — ATORVASTATIN CALCIUM 80 MG PO TABS: 80.0000 mg | ORAL_TABLET | Freq: Every day | ORAL | Status: DC

## 2013-12-09 NOTE — Progress Notes (Signed)
Subjective:    Patient ID: Alex Robinson, male    DOB: Jun 05, 1925, 80 y.o.   MRN: [id number]  HPI History is from pt and caretaker.  Pt states few months of moderate rhinorrhea from both nares, and slight assoc nasal congestion.   Past Medical History  Diagnosis Date  . ALLERGIC RHINITIS 09/22/2006  . ANEMIA-NOS 09/22/2006  . CARCINOMA, SKIN, SQUAMOUS CELL 01/04/2009  . Cough 02/01/2008  . DIABETES MELLITUS, TYPE II 09/22/2006  . DIVERTICULITIS, HX OF 09/22/2006  . GERD 09/22/2006  . HYPERLIPIDEMIA 09/22/2006  . HYPERTENSION 09/22/2006  . Impacted cerumen 11/06/2007  . INGUINAL HERNIA, RIGHT 12/20/2008  . OSTEOARTHRITIS 11/06/2007  . PSA, INCREASED 11/06/2007  . RECTAL BLEEDING 04/04/2010  . TREMOR 04/23/2007    in hands  . URINARY RETENTION 04/04/2010  . Vitiligo 11/06/2007  . Asthma     as a child  . Neuromuscular disorder     tremor- hands    Past Surgical History  Procedure Laterality Date  . Appendectomy  1946  . Tonsillectomy and adenoidectomy  1939  . Inner ear surgery  1960  . Cataract extraction      History   Social History  . Marital Status: Widowed    Spouse Name: N/A    Number of Children: 3  . Years of Education: N/A   Occupational History  . Retired    Social History Main Topics  . Smoking status: Former Research scientist (life sciences)  . Smokeless tobacco: Never Used     Comment: quit smoking at 20  . Alcohol Use: No  . Drug Use: No  . Sexual Activity: Not on file   Other Topics Concern  . Not on file   Social History Narrative   Widowed 2008   Pt says he gets exercise.     Current Outpatient Prescriptions on File Prior to Visit  Medication Sig Dispense Refill  . amLODipine (NORVASC) 5 MG tablet TAKE 1 TABLET BY MOUTH EVERY DAY  90 tablet  0  . aspirin 81 MG tablet Take 81 mg by mouth daily.        . Blood Glucose Monitoring Suppl (ONE TOUCH ULTRA SYSTEM KIT) W/DEVICE KIT 1 kit by Does not apply route once.  1 each  0  . donepezil (ARICEPT) 5 MG tablet TAKE 1 TABLET BY  MOUTH AT BEDTIME  90 tablet  0  . glucose blood (ONE TOUCH ULTRA TEST) test strip 1 each by Other route daily. And lancets 1/day 250.00  100 each  12  . KLOR-CON M10 10 MEQ tablet TAKE 1 TABLET BY MOUTH EVERY DAY  30 tablet  11  . Omega-3 Fatty Acids (FISH OIL) 1200 MG CAPS Take 1 capsule by mouth 2 (two) times daily.        . pioglitazone (ACTOS) 30 MG tablet TAKE 1 TABLET BY MOUTH DAILY  90 tablet  1  . tamsulosin (FLOMAX) 0.4 MG CAPS capsule TAKE 1 CAPSULE BY MOUTH TWO TIMES A DAY  180 capsule  3  . [DISCONTINUED] potassium chloride (K-DUR) 10 MEQ tablet Take 1 tablet (10 mEq total) by mouth daily.  30 tablet  11   No current facility-administered medications on file prior to visit.    Allergies  Allergen Reactions  . Niacin Hives  . Sulfamethoxazole-Trimethoprim Hives    Family History  Problem Relation Age of Onset  . Stroke Mother   . Heart attack Father   . Colon cancer Neg Hx   . Esophageal cancer Neg Hx   .  Stomach cancer Neg Hx   . Rectal cancer Neg Hx     BP 114/64  Pulse 54  Temp(Src) 97.9 F (36.6 C) (Oral)  Ht $R'5\' 6"'VJ$  (1.676 m)  Wt 160 lb (72.576 kg)  BMI 25.84 kg/m2    Review of Systems Denies chest pain and sob.  No change in chronic memory loss.    Objective:   Physical Exam Vital signs: see vs page Gen: elderly, frail, no distress head: no deformity eyes: no periorbital swelling, no proptosis external nose and ears are normal mouth: no lesion seen Both eac's and tm's are normal.  bilat hering aids. NECK: There is no palpable thyroid enlargement.  No thyroid nodule is palpable.  No palpable lymphadenopathy at the anterior neck. LUNGS:  Clear to auscultation HEART:  Regular rate and rhythm without murmurs noted. Normal S1,S2.   Pulses: dorsalis pedis intact bilat.   Feet: no deformity.  no edema.  There is bilateral onychomycosis Skin:  no ulcer on the feet.  normal color and temp. Neuro: sensation is intact to touch on the feet Ext: moderate OA  changes Neuro: moderate coarse tremor throughout the body.   Lab Results  Component Value Date   WBC 6.6 12/09/2013   HGB 13.2 12/09/2013   HCT 41.3 12/09/2013   PLT 164.0 12/09/2013   GLUCOSE 66* 12/09/2013   CHOL 136 12/09/2013   TRIG 126.0 12/09/2013   HDL 29.00* 12/09/2013   LDLDIRECT 49.3 08/10/2012   LDLCALC 82 12/09/2013   ALT 29 12/09/2013   AST 25 12/09/2013   NA 139 12/09/2013   K 4.8 12/09/2013   CL 104 12/09/2013   CREATININE 1.6* 12/09/2013   BUN 29* 12/09/2013   CO2 25 12/09/2013   TSH 1.10 12/09/2013   PSA 2.75 12/09/2013   HGBA1C 6.1 12/09/2013   MICROALBUR 30.5* 12/09/2013       Assessment & Plan:  Memory loss, persistent: Pt declines to increase aricept Renal insuff: new.  We'll recheck in the future. Allergic rhinitis, persistent: i advised loratadine and flonase.     Subjective:   Patient here for Medicare annual wellness visit and management of other chronic and acute problems.     Risk factors: advanced age    4 of Physicians Providing Medical Care to Patient:  See "snapshot"   Activities of Daily Living: In your present state of health, do you have any difficulty performing the following activities?:  Preparing food and eating?: caretaker (7 hrs/day, 6 days/week) does Bathing yourself: no Getting dressed: No  Using the toilet: No  Moving around from place to place: No  In the past year have you fallen or had a near fall?:No    Home Safety: Has smoke detector and wears seat belts. No firearms. No excess sun exposure.  Diet and Exercise  Current exercise habits: limited by health problems.   Dietary issues discussed: caretaker prepares healthy diet   Depression Screen  Q1: Over the past two weeks, have you felt down, depressed or hopeless?no  Q2: Over the past two weeks, have you felt little interest or pleasure in doing things? no   The following portions of the patient's history were reviewed and updated as appropriate:  allergies, current medications, past family history, past medical history, past social history, past surgical history and problem list.   Review of Systems  No change in chronic hearing loss; denies visual loss Objective:   Vision:  Sees opthalmologist Hearing: grossly normal with hearing aids Body mass index:  See vs page Msk: pt slowly performs "get-up-and-go" from a sitting position.  Cognitive Impairment Assessment: cognition, memory and judgment appear normal.  remembers 3/3 at 5 minutes.  excellent recall.  can easily read a sentence.  He writes with great difficulty, due to tremor.  alert and oriented x 3 (except 12/14/13).    Assessment:   Medicare wellness utd on preventive parameters    Plan:   During the course of the visit the patient was educated and counseled about appropriate screening and preventive services including:        Fall prevention   Diabetes screening  Nutrition counseling   Vaccines / LABS Zostavax / Pneumococcal Vaccine  today  PSA  Patient Instructions (the written plan) was given to the patient.   we discussed code status.  pt requests to think it over.

## 2013-12-09 NOTE — Patient Instructions (Addendum)
Loratadine and flonase spray (both non-prescription) will help your runny nose. please consider these measures for your health:  minimize alcohol.  do not use tobacco products.  have a colonoscopy at least every 10 years from age 78.   keep firearms safely stored.  always use seat belts.  have working smoke alarms in your home.  see an eye doctor and dentist regularly.  never drive under the influence of alcohol or drugs (including prescription drugs).  those with fair skin should take precautions against the sun. please let me know what your wishes would be, if artificial life support measures should become necessary.  it is critically important to prevent falling down (keep floor areas well-lit, dry, and free of loose objects.  If you have a cane, walker, or wheelchair, you should use it, even for short trips around the house.  Also, try not to rush).   Please come back for a follow-up appointment in 6 months.

## 2014-01-13 ENCOUNTER — Other Ambulatory Visit: Payer: Self-pay | Admitting: Endocrinology

## 2014-01-20 ENCOUNTER — Other Ambulatory Visit: Payer: Self-pay | Admitting: Endocrinology

## 2014-01-22 ENCOUNTER — Other Ambulatory Visit: Payer: Self-pay | Admitting: Endocrinology

## 2014-01-24 NOTE — Telephone Encounter (Signed)
Please advise if ok to refill. Medication is not on current list.  Thanks! 

## 2014-02-05 ENCOUNTER — Other Ambulatory Visit: Payer: Self-pay | Admitting: Endocrinology

## 2014-02-10 ENCOUNTER — Other Ambulatory Visit: Payer: Self-pay | Admitting: Endocrinology

## 2014-02-14 ENCOUNTER — Other Ambulatory Visit: Payer: Self-pay | Admitting: Endocrinology

## 2014-03-04 ENCOUNTER — Other Ambulatory Visit: Payer: Self-pay | Admitting: Endocrinology

## 2014-05-07 ENCOUNTER — Other Ambulatory Visit: Payer: Self-pay | Admitting: Endocrinology

## 2014-06-04 ENCOUNTER — Other Ambulatory Visit: Payer: Self-pay | Admitting: Endocrinology

## 2014-06-10 ENCOUNTER — Ambulatory Visit: Payer: Medicare Other | Admitting: Endocrinology

## 2014-08-02 ENCOUNTER — Other Ambulatory Visit: Payer: Self-pay | Admitting: Endocrinology

## 2014-08-06 ENCOUNTER — Other Ambulatory Visit: Payer: Self-pay | Admitting: Endocrinology

## 2014-08-31 ENCOUNTER — Other Ambulatory Visit: Payer: Self-pay | Admitting: Endocrinology

## 2014-08-31 ENCOUNTER — Telehealth: Payer: Self-pay | Admitting: Endocrinology

## 2014-09-01 ENCOUNTER — Encounter: Payer: Self-pay | Admitting: Endocrinology

## 2014-09-01 ENCOUNTER — Ambulatory Visit (INDEPENDENT_AMBULATORY_CARE_PROVIDER_SITE_OTHER): Payer: Medicare HMO | Admitting: Endocrinology

## 2014-09-01 VITALS — BP 145/98 | HR 63 | Temp 97.6°F | Resp 15 | Ht 68.5 in | Wt 168.0 lb

## 2014-09-01 DIAGNOSIS — E1122 Type 2 diabetes mellitus with diabetic chronic kidney disease: Secondary | ICD-10-CM | POA: Diagnosis not present

## 2014-09-01 DIAGNOSIS — N189 Chronic kidney disease, unspecified: Secondary | ICD-10-CM | POA: Diagnosis not present

## 2014-09-01 DIAGNOSIS — E119 Type 2 diabetes mellitus without complications: Secondary | ICD-10-CM

## 2014-09-01 DIAGNOSIS — I10 Essential (primary) hypertension: Secondary | ICD-10-CM

## 2014-09-01 LAB — BASIC METABOLIC PANEL
BUN: 25 mg/dL — ABNORMAL HIGH (ref 6–23)
CHLORIDE: 104 meq/L (ref 96–112)
CO2: 29 mEq/L (ref 19–32)
Calcium: 8.7 mg/dL (ref 8.4–10.5)
Creatinine, Ser: 1.31 mg/dL (ref 0.40–1.50)
GFR: 55.04 mL/min — ABNORMAL LOW (ref >60.00)
Glucose, Bld: 96 mg/dL (ref 70–99)
POTASSIUM: 4.2 meq/L (ref 3.5–5.1)
SODIUM: 138 meq/L (ref 135–145)

## 2014-09-01 LAB — HEMOGLOBIN A1C: Hgb A1c MFr Bld: 5.7 % (ref 4.6–6.5)

## 2014-09-01 MED ORDER — PIOGLITAZONE HCL 30 MG PO TABS: 30.0000 mg | ORAL_TABLET | Freq: Every day | ORAL | Status: DC

## 2014-09-01 MED ORDER — POTASSIUM CHLORIDE CRYS ER 10 MEQ PO TBCR: 10.0000 meq | EXTENDED_RELEASE_TABLET | Freq: Every day | ORAL | Status: DC

## 2014-09-01 MED ORDER — TAMSULOSIN HCL 0.4 MG PO CAPS: ORAL_CAPSULE | ORAL | Status: DC

## 2014-09-01 MED ORDER — AMLODIPINE BESYLATE 5 MG PO TABS: ORAL_TABLET | ORAL | Status: DC

## 2014-09-01 MED ORDER — DONEPEZIL HCL 10 MG PO TABS: 10.0000 mg | ORAL_TABLET | Freq: Every day | ORAL | Status: DC

## 2014-09-01 MED ORDER — ATORVASTATIN CALCIUM 80 MG PO TABS: ORAL_TABLET | ORAL | Status: DC

## 2014-09-01 MED ORDER — METOPROLOL SUCCINATE ER 100 MG PO TB24: ORAL_TABLET | ORAL | Status: DC

## 2014-09-01 NOTE — Patient Instructions (Addendum)
You can stop the terbinafine pills, as they are not working. blood tests are requested for you today.  We'll let you know about the results.  i have sent a prescription to your pharmacy, to double the aricept. Please see Dr Alain Marion, at the old office, for the skin problems on your ears.   Please come back for a follow-up appointment in 3 months.

## 2014-09-01 NOTE — Progress Notes (Signed)
Subjective:    Patient ID: Alex Robinson, male    DOB: 07-30-1925, 81 y.o.   MRN: [id number]  HPI Pt returns for f/u of diabetes mellitus: DM type: 2 Dx'ed: 1423 Complications: nephropathy Therapy: pioglitizone DKA: never Severe hypoglycemia: never Pancreatitis: never Other: renal insufficiency limits oral med options Interval history:  [employer]), says he has been on lamisil x 1 year. She also says memory is better after taking aricept, but still not good.  Past Medical History  Diagnosis Date  . ALLERGIC RHINITIS 09/22/2006  . ANEMIA-NOS 09/22/2006  . CARCINOMA, SKIN, SQUAMOUS CELL 01/04/2009  . Cough 02/01/2008  . DIABETES MELLITUS, TYPE II 09/22/2006  . DIVERTICULITIS, HX OF 09/22/2006  . GERD 09/22/2006  . HYPERLIPIDEMIA 09/22/2006  . HYPERTENSION 09/22/2006  . Impacted cerumen 11/06/2007  . INGUINAL HERNIA, RIGHT 12/20/2008  . OSTEOARTHRITIS 11/06/2007  . PSA, INCREASED 11/06/2007  . RECTAL BLEEDING 04/04/2010  . TREMOR 04/23/2007    in hands  . URINARY RETENTION 04/04/2010  . Vitiligo 11/06/2007  . Asthma     as a child  . Neuromuscular disorder     tremor- hands    Past Surgical History  Procedure Laterality Date  . Appendectomy  1946  . Tonsillectomy and adenoidectomy  1939  . Inner ear surgery  1960  . Cataract extraction      History   Social History  . Marital Status: Widowed    Spouse Name: N/A  . Number of Children: 3  . Years of Education: N/A   Occupational History  . Retired    Social History Main Topics  . Smoking status: Former Research scientist (life sciences)  . Smokeless tobacco: Never Used     Comment: quit smoking at 20  . Alcohol Use: No  . Drug Use: No  . Sexual Activity: Not on file   Other Topics Concern  . Not on file   Social History Narrative   Widowed 2008   Pt says he gets exercise.     Current Outpatient Prescriptions on File Prior to Visit  Medication Sig Dispense Refill  . aspirin 81 MG tablet Take 81 mg by mouth daily.      .  Blood Glucose Monitoring Suppl (ONE TOUCH ULTRA SYSTEM KIT) W/DEVICE KIT 1 kit by Does not apply route once. 1 each 0  . glucose blood (ONE TOUCH ULTRA TEST) test strip 1 each by Other route daily. And lancets 1/day 250.00 100 each 12  . Omega-3 Fatty Acids (FISH OIL) 1200 MG CAPS Take 1 capsule by mouth 2 (two) times daily.      . [DISCONTINUED] potassium chloride (K-DUR) 10 MEQ tablet Take 1 tablet (10 mEq total) by mouth daily. 30 tablet 11   No current facility-administered medications on file prior to visit.    Allergies  Allergen Reactions  . Niacin Hives  . Sulfamethoxazole-Trimethoprim Hives    Family History  Problem Relation Age of Onset  . Stroke Mother   . Heart attack Father   . Colon cancer Neg Hx   . Esophageal cancer Neg Hx   . Stomach cancer Neg Hx   . Rectal cancer Neg Hx     BP 145/98 mmHg  Pulse 63  Temp(Src) 97.6 F (36.4 C) (Oral)  Resp 15  Ht 5' 8.5" (1.74 m)  Wt 168 lb (76.204 kg)  BMI 25.17 kg/m2  SpO2 91%   Review of Systems Denies falls and LOC    Objective:   Physical Exam VITAL SIGNS:  See vs page GENERAL: no distress Pulses: dorsalis pedis intact bilat.   MSK: no deformity of the feet CV: no leg edema Skin:  no ulcer on the feet.  normal color and temp on the feet. Neuro: sensation is intact to touch on the feet. There is bilateral onychomycosis of the toenails.  PSYCH: Alert and well-oriented.  Does not appear anxious nor depressed.     Lab Results  Component Value Date   HGBA1C 5.7 09/01/2014    i personally reviewed electrocardiogram tracing: normal except for PVC's.     Assessment & Plan:  Skin nodules on the ears, 1 of which is AK. Onychomycosis, not improved with lamisil Memory loss, persistent. DM: well-controlled.  Patient is advised the following: Patient Instructions  You can stop the terbinafine pills, as they are not working. blood tests are requested for you today.  We'll let you know about the results.  i  have sent a prescription to your pharmacy, to double the aricept. Please see Dr Alain Marion, at the old office, for the skin problems on your ears.   Please come back for a follow-up appointment in 3 months.

## 2014-09-02 ENCOUNTER — Other Ambulatory Visit: Payer: Self-pay | Admitting: Endocrinology

## 2014-09-03 ENCOUNTER — Other Ambulatory Visit: Payer: Self-pay | Admitting: Endocrinology

## 2014-11-11 ENCOUNTER — Other Ambulatory Visit: Payer: Self-pay | Admitting: Endocrinology

## 2014-11-11 NOTE — Telephone Encounter (Signed)
This is a one-time course of rx, which doesn't automatically get refilled. i can re-eval when I next see pt

## 2014-11-11 NOTE — Telephone Encounter (Signed)
Please advise if ok to refill rx. Medication is not on the pt's current list.  Thanks!

## 2014-11-18 ENCOUNTER — Other Ambulatory Visit: Payer: Self-pay | Admitting: Endocrinology

## 2014-12-01 ENCOUNTER — Other Ambulatory Visit: Payer: Self-pay | Admitting: Endocrinology

## 2014-12-02 ENCOUNTER — Encounter: Payer: Self-pay | Admitting: Endocrinology

## 2014-12-02 ENCOUNTER — Ambulatory Visit (INDEPENDENT_AMBULATORY_CARE_PROVIDER_SITE_OTHER): Payer: Medicare HMO | Admitting: Endocrinology

## 2014-12-02 VITALS — BP 144/87 | HR 49 | Temp 97.6°F | Ht 68.5 in | Wt 170.0 lb

## 2014-12-02 DIAGNOSIS — E119 Type 2 diabetes mellitus without complications: Secondary | ICD-10-CM | POA: Diagnosis not present

## 2014-12-02 DIAGNOSIS — C4490 Unspecified malignant neoplasm of skin, unspecified: Secondary | ICD-10-CM

## 2014-12-02 LAB — POCT GLYCOSYLATED HEMOGLOBIN (HGB A1C): HEMOGLOBIN A1C: 5.8

## 2014-12-02 MED ORDER — MEMANTINE HCL 5 MG PO TABS: 5.0000 mg | ORAL_TABLET | Freq: Two times a day (BID) | ORAL | Status: DC

## 2014-12-02 MED ORDER — METOPROLOL SUCCINATE ER 50 MG PO TB24
50.0000 mg | ORAL_TABLET | Freq: Every day | ORAL | Status: DC
Start: 2014-12-02 — End: 2015-06-06

## 2014-12-02 NOTE — Progress Notes (Signed)
Subjective:    Patient ID: Alex Robinson, male    DOB: 06-Jun-1925, 81 y.o.   MRN: [id number]  HPI Pt returns for f/u of diabetes mellitus: DM type: 2 Dx'ed: 2007 Complications: nephropathy Therapy: pioglitizone DKA: never Severe hypoglycemia: never Pancreatitis: never Other: renal insufficiency limits oral med options Interval history: [employer]), says memory is better after taking aricept, but still not good.   Past Medical History  Diagnosis Date  . ALLERGIC RHINITIS 09/22/2006  . ANEMIA-NOS 09/22/2006  . CARCINOMA, SKIN, SQUAMOUS CELL 01/04/2009  . Cough 02/01/2008  . DIABETES MELLITUS, TYPE II 09/22/2006  . DIVERTICULITIS, HX OF 09/22/2006  . GERD 09/22/2006  . HYPERLIPIDEMIA 09/22/2006  . HYPERTENSION 09/22/2006  . Impacted cerumen 11/06/2007  . INGUINAL HERNIA, RIGHT 12/20/2008  . OSTEOARTHRITIS 11/06/2007  . PSA, INCREASED 11/06/2007  . RECTAL BLEEDING 04/04/2010  . TREMOR 04/23/2007    in hands  . URINARY RETENTION 04/04/2010  . Vitiligo 11/06/2007  . Asthma     as a child  . Neuromuscular disorder (HCC)     tremor- hands    Past Surgical History  Procedure Laterality Date  . Appendectomy  1946  . Tonsillectomy and adenoidectomy  1939  . Inner ear surgery  1960  . Cataract extraction      Social History   Social History  . Marital Status: Widowed    Spouse Name: N/A  . Number of Children: 3  . Years of Education: N/A   Occupational History  . Retired    Social History Main Topics  . Smoking status: Former Games developer  . Smokeless tobacco: Never Used     Comment: quit smoking at 20  . Alcohol Use: No  . Drug Use: No  . Sexual Activity: Not on file   Other Topics Concern  . Not on file   Social History Narrative   Widowed 2008   Pt says he gets exercise.     Current Outpatient Prescriptions on File Prior to Visit  Medication Sig Dispense Refill  . amLODipine (NORVASC) 5 MG tablet TAKE 1 TABLET BY MOUTH EVERY DAY 90 tablet 11  . aspirin 81  MG tablet Take 81 mg by mouth daily.      [name] Kitchen atorvastatin (LIPITOR) 80 MG tablet TAKE 1 TABLET (80 MG TOTAL) BY MOUTH DAILY. 90 tablet 3  . Blood Glucose Monitoring Suppl (ONE TOUCH ULTRA SYSTEM KIT) W/DEVICE KIT 1 kit by Does not apply route once. 1 each 0  . donepezil (ARICEPT) 10 MG tablet Take 1 tablet (10 mg total) by mouth at bedtime. 90 tablet 3  . glucose blood (ONE TOUCH ULTRA TEST) test strip 1 each by Other route daily. And lancets 1/day 250.00 100 each 12  . Omega-3 Fatty Acids (FISH OIL) 1200 MG CAPS Take 1 capsule by mouth 2 (two) times daily.      . pioglitazone (ACTOS) 30 MG tablet Take 1 tablet (30 mg total) by mouth daily. 90 tablet 3  . potassium chloride (KLOR-CON M10) 10 MEQ tablet Take 1 tablet (10 mEq total) by mouth daily. 90 tablet 3  . tamsulosin (FLOMAX) 0.4 MG CAPS capsule TAKE 1 CAPSULE BY MOUTH TWO TIMES A DAY 180 capsule 3  . [DISCONTINUED] potassium chloride (K-DUR) 10 MEQ tablet Take 1 tablet (10 mEq total) by mouth daily. 30 tablet 11   No current facility-administered medications on file prior to visit.    Allergies  Allergen Reactions  . Niacin Hives  . Sulfamethoxazole-Trimethoprim Hives  Family History  Problem Relation Age of Onset  . Stroke Mother   . Heart attack Father   . Colon cancer Neg Hx   . Esophageal cancer Neg Hx   . Stomach cancer Neg Hx   . Rectal cancer Neg Hx     BP 144/87 mmHg  Pulse 49  Temp(Src) 97.6 F (36.4 C) (Oral)  Ht 5' 8.5" (1.74 m)  Wt 170 lb (77.111 kg)  BMI 25.47 kg/m2  SpO2 98%  Review of Systems Denies edema and dizziness    Objective:   Physical Exam Vital signs: see vs page Gen: elderly, frail, no distress Skin: multiple AK's on the head.  Pulses: dorsalis pedis intact bilat.   MSK: no deformity of the feet  CV: no leg edema  Skin: no ulcer on the feet. normal color and temp on the feet.  Neuro: sensation is intact to touch on the feet.  There is bilateral onychomycosis of the toenails.     Lab Results  Component Value Date   HGBA1C 5.8 12/02/2014      Assessment & Plan:  DM: well-controlled Dementia, persistent Bradycardia, due to metoprolol AK's, persistent HTN: we'll follow on reduced metoprolol.   Patient is advised the following: Patient Instructions  Please come back for a regular physical appointment in 2 months.   Please continue the same medication for diabetes.    i have sent a prescription to your pharmacy, for an additional memory pill.   i have also sent a prescription to your pharmacy, to reduce the metoprolol.   Please see a skin specialist.

## 2014-12-02 NOTE — Patient Instructions (Addendum)
Please come back for a regular physical appointment in 2 months.   Please continue the same medication for diabetes.    i have sent a prescription to your pharmacy, for an additional memory pill.   i have also sent a prescription to your pharmacy, to reduce the metoprolol.   Please see a skin specialist.

## 2014-12-10 DIAGNOSIS — E119 Type 2 diabetes mellitus without complications: Secondary | ICD-10-CM | POA: Diagnosis not present

## 2014-12-14 ENCOUNTER — Telehealth: Payer: Self-pay | Admitting: Endocrinology

## 2014-12-14 DIAGNOSIS — D0422 Carcinoma in situ of skin of left ear and external auricular canal: Secondary | ICD-10-CM | POA: Diagnosis not present

## 2014-12-14 DIAGNOSIS — L820 Inflamed seborrheic keratosis: Secondary | ICD-10-CM | POA: Diagnosis not present

## 2014-12-14 NOTE — Telephone Encounter (Signed)
Information has been faxed  

## 2014-12-14 NOTE — Telephone Encounter (Signed)
Patient need copy of all medication to [facility] - AMB REFERRAL TO DERMATOLOGY  Fax # [phone]

## 2015-02-01 ENCOUNTER — Encounter: Payer: Self-pay | Admitting: Endocrinology

## 2015-02-01 ENCOUNTER — Ambulatory Visit
Admission: RE | Admit: 2015-02-01 | Discharge: 2015-02-01 | Disposition: A | Discharge status: Home / Self Care | Payer: Medicare HMO | Source: Ambulatory Visit | Attending: Endocrinology | Admitting: Endocrinology

## 2015-02-01 ENCOUNTER — Ambulatory Visit (INDEPENDENT_AMBULATORY_CARE_PROVIDER_SITE_OTHER): Payer: Medicare HMO | Admitting: Endocrinology

## 2015-02-01 VITALS — BP 132/87 | HR 57 | Temp 98.3°F | Ht 68.5 in | Wt 167.0 lb

## 2015-02-01 DIAGNOSIS — N183 Chronic kidney disease, stage 3 unspecified: Secondary | ICD-10-CM

## 2015-02-01 DIAGNOSIS — R413 Other amnesia: Secondary | ICD-10-CM

## 2015-02-01 DIAGNOSIS — N3000 Acute cystitis without hematuria: Secondary | ICD-10-CM

## 2015-02-01 DIAGNOSIS — R05 Cough: Secondary | ICD-10-CM

## 2015-02-01 DIAGNOSIS — N390 Urinary tract infection, site not specified: Secondary | ICD-10-CM | POA: Insufficient documentation

## 2015-02-01 DIAGNOSIS — E785 Hyperlipidemia, unspecified: Secondary | ICD-10-CM | POA: Diagnosis not present

## 2015-02-01 DIAGNOSIS — J209 Acute bronchitis, unspecified: Secondary | ICD-10-CM

## 2015-02-01 DIAGNOSIS — Z0001 Encounter for general adult medical examination with abnormal findings: Secondary | ICD-10-CM

## 2015-02-01 DIAGNOSIS — D649 Anemia, unspecified: Secondary | ICD-10-CM

## 2015-02-01 DIAGNOSIS — R0602 Shortness of breath: Secondary | ICD-10-CM | POA: Diagnosis not present

## 2015-02-01 DIAGNOSIS — I10 Essential (primary) hypertension: Secondary | ICD-10-CM

## 2015-02-01 DIAGNOSIS — K219 Gastro-esophageal reflux disease without esophagitis: Secondary | ICD-10-CM

## 2015-02-01 DIAGNOSIS — E1122 Type 2 diabetes mellitus with diabetic chronic kidney disease: Secondary | ICD-10-CM | POA: Diagnosis not present

## 2015-02-01 DIAGNOSIS — R059 Cough, unspecified: Secondary | ICD-10-CM

## 2015-02-01 DIAGNOSIS — Z0000 Encounter for general adult medical examination without abnormal findings: Secondary | ICD-10-CM

## 2015-02-01 LAB — LIPID PANEL
CHOL/HDL RATIO: 5
Cholesterol: 138 mg/dL (ref 0–200)
HDL: 30 mg/dL — AB (ref >39.00)
LDL CALC: 80 mg/dL (ref 0–99)
NonHDL: 107.81
TRIGLYCERIDES: 137 mg/dL (ref 0.0–149.0)
VLDL: 27.4 mg/dL (ref 0.0–40.0)

## 2015-02-01 LAB — URINALYSIS, ROUTINE W REFLEX MICROSCOPIC
Bilirubin Urine: NEGATIVE
Ketones, ur: NEGATIVE
NITRITE: NEGATIVE
PH: 5.5 (ref 5.0–8.0)
SPECIFIC GRAVITY, URINE: 1.025 (ref 1.000–1.030)
Total Protein, Urine: 30 — AB
Urine Glucose: NEGATIVE
Urobilinogen, UA: 0.2 (ref 0.0–1.0)

## 2015-02-01 LAB — BASIC METABOLIC PANEL
BUN: 26 mg/dL — AB (ref 6–23)
CHLORIDE: 107 meq/L (ref 96–112)
CO2: 30 meq/L (ref 19–32)
CREATININE: 1.44 mg/dL (ref 0.40–1.50)
Calcium: 9.1 mg/dL (ref 8.4–10.5)
GFR: 49.3 mL/min — ABNORMAL LOW (ref >60.00)
Glucose, Bld: 66 mg/dL — ABNORMAL LOW (ref 70–99)
Potassium: 4.8 mEq/L (ref 3.5–5.1)
Sodium: 143 mEq/L (ref 135–145)

## 2015-02-01 LAB — CBC WITH DIFFERENTIAL/PLATELET
BASOS ABS: 0 10*3/uL (ref 0.0–0.1)
Basophils Relative: 0.4 % (ref 0.0–3.0)
EOS PCT: 7.1 % — AB (ref 0.0–5.0)
Eosinophils Absolute: 0.6 10*3/uL (ref 0.0–0.7)
HCT: 40.5 % (ref 39.0–52.0)
HEMOGLOBIN: 13.2 g/dL (ref 13.0–17.0)
LYMPHS ABS: 2 10*3/uL (ref 0.7–4.0)
Lymphocytes Relative: 22 % (ref 12.0–46.0)
MCHC: 32.5 g/dL (ref 30.0–36.0)
MCV: 94.7 fl (ref 78.0–100.0)
MONO ABS: 1.3 10*3/uL — AB (ref 0.1–1.0)
MONOS PCT: 14.2 % — AB (ref 3.0–12.0)
NEUTROS PCT: 56.3 % (ref 43.0–77.0)
Neutro Abs: 5 10*3/uL (ref 1.4–7.7)
Platelets: 207 10*3/uL (ref 150.0–400.0)
RBC: 4.28 Mil/uL (ref 4.22–5.81)
RDW: 14.7 % (ref 11.5–15.5)
WBC: 8.9 10*3/uL (ref 4.0–10.5)

## 2015-02-01 LAB — MICROALBUMIN / CREATININE URINE RATIO
Creatinine,U: 205.8 mg/dL
MICROALB/CREAT RATIO: 15 mg/g (ref 0.0–30.0)
Microalb, Ur: 30.9 mg/dL — ABNORMAL HIGH (ref 0.0–1.9)

## 2015-02-01 LAB — IBC PANEL
Iron: 28 ug/dL — ABNORMAL LOW (ref 42–165)
SATURATION RATIOS: 10.8 % — AB (ref 20.0–50.0)
TRANSFERRIN: 186 mg/dL — AB (ref 212.0–360.0)

## 2015-02-01 LAB — VITAMIN D 25 HYDROXY (VIT D DEFICIENCY, FRACTURES): VITD: 21.67 ng/mL — ABNORMAL LOW (ref 30.00–100.00)

## 2015-02-01 LAB — HEPATIC FUNCTION PANEL
ALBUMIN: 3.7 g/dL (ref 3.5–5.2)
ALK PHOS: 69 U/L (ref 39–117)
ALT: 14 U/L (ref 0–53)
AST: 21 U/L (ref 0–37)
BILIRUBIN DIRECT: 0.1 mg/dL (ref 0.0–0.3)
TOTAL PROTEIN: 6.6 g/dL (ref 6.0–8.3)
Total Bilirubin: 0.4 mg/dL (ref 0.2–1.2)

## 2015-02-01 LAB — VITAMIN B12: VITAMIN B 12: 364 pg/mL (ref 211–911)

## 2015-02-01 LAB — POCT GLYCOSYLATED HEMOGLOBIN (HGB A1C): HEMOGLOBIN A1C: 5.6

## 2015-02-01 LAB — TSH: TSH: 1.18 u[IU]/mL (ref 0.35–4.50)

## 2015-02-01 MED ORDER — CEFUROXIME AXETIL 500 MG PO TABS: 500.0000 mg | ORAL_TABLET | Freq: Two times a day (BID) | ORAL | Status: DC

## 2015-02-01 MED ORDER — FLUTICASONE-SALMETEROL 100-50 MCG/DOSE IN AEPB: 1.0000 | INHALATION_SPRAY | Freq: Two times a day (BID) | RESPIRATORY_TRACT | Status: DC

## 2015-02-01 MED ORDER — CEFUROXIME AXETIL 250 MG PO TABS: 250.0000 mg | ORAL_TABLET | Freq: Two times a day (BID) | ORAL | Status: DC

## 2015-02-01 NOTE — Progress Notes (Signed)
we discussed code status.  pt requests full code, but would not want to be started or maintained on artificial life-support measures if there was not a reasonable chance of recovery 

## 2015-02-01 NOTE — Patient Instructions (Addendum)
Please continue the same medication for diabetes. i have sent 2 prescriptions to your pharmacy (inhaler and antibiotic).  blood tests are requested for you today.  We'll let you know about the results.   please consider these measures for your health:  minimize alcohol.  do not use tobacco products.  have a colonoscopy at least every 10 years from age 81.  keep firearms safely stored.  always use seat belts.  have working smoke alarms in your home.  see an eye doctor and dentist regularly.  never drive under the influence of alcohol or drugs (including prescription drugs).  those with fair skin should take precautions against the sun.   Loratadine-d (non-prescription) will help your congestion.   it is critically important to prevent falling down (keep floor areas well-lit, dry, and free of loose objects.  If you have a cane, walker, or wheelchair, you should use it, even for short trips around the house.  Also, try not to rush) good diet and exercise significantly improve the control of your diabetes.  please let me know if you wish to be referred to a dietician.  high blood sugar is very risky to your health.  you should see an eye doctor and dentist every year.  It is very important to get all recommended vaccinations.  Please stop downstairs for a chest x-ray.   I hope you feel better soon.  If you don't feel better by next week, please call back.  Please call sooner if you get worse. Please come back for a follow-up appointment in 4 months.

## 2015-02-01 NOTE — Progress Notes (Signed)
Subjective:    Patient ID: Alex Robinson, male    DOB: [birth year], 81 y.o.   MRN: [id number]  HPI Pt states few days of slight dry-quality cough in the chest, and assoc nasal congestion.   Past Medical History  Diagnosis Date  . ALLERGIC RHINITIS 09/22/2006  . ANEMIA-NOS 09/22/2006  . CARCINOMA, SKIN, SQUAMOUS CELL 01/04/2009  . Cough 02/01/2008  . DIABETES MELLITUS, TYPE II 09/22/2006  . DIVERTICULITIS, HX OF 09/22/2006  . GERD 09/22/2006  . HYPERLIPIDEMIA 09/22/2006  . HYPERTENSION 09/22/2006  . Impacted cerumen 11/06/2007  . INGUINAL HERNIA, RIGHT 12/20/2008  . OSTEOARTHRITIS 11/06/2007  . PSA, INCREASED 11/06/2007  . RECTAL BLEEDING 04/04/2010  . TREMOR 04/23/2007    in hands  . URINARY RETENTION 04/04/2010  . Vitiligo 11/06/2007  . Asthma     as a child  . Neuromuscular disorder (HCC)     tremor- hands    Past Surgical History  Procedure Laterality Date  . Appendectomy  1946  . Tonsillectomy and adenoidectomy  1939  . Inner ear surgery  1960  . Cataract extraction      Social History   Social History  . Marital Status: Widowed    Spouse Name: N/A  . Number of Children: 3  . Years of Education: N/A   Occupational History  . Retired    Social History Main Topics  . Smoking status: Former Research scientist (life sciences)  . Smokeless tobacco: Never Used     Comment: quit smoking at 20  . Alcohol Use: No  . Drug Use: No  . Sexual Activity: Not on file   Other Topics Concern  . Not on file   Social History Narrative   Widowed 2008   Pt says he gets exercise.     Current Outpatient Prescriptions on File Prior to Visit  Medication Sig Dispense Refill  . amLODipine (NORVASC) 5 MG tablet TAKE 1 TABLET BY MOUTH EVERY DAY 90 tablet 11  . aspirin 81 MG tablet Take 81 mg by mouth daily.      [name] Kitchen atorvastatin (LIPITOR) 80 MG tablet TAKE 1 TABLET (80 MG TOTAL) BY MOUTH DAILY. 90 tablet 3  . Blood Glucose Monitoring Suppl (ONE TOUCH ULTRA SYSTEM KIT) W/DEVICE KIT 1 kit by Does not apply route once.  1 each 0  . donepezil (ARICEPT) 10 MG tablet Take 1 tablet (10 mg total) by mouth at bedtime. 90 tablet 3  . glucose blood (ONE TOUCH ULTRA TEST) test strip 1 each by Other route daily. And lancets 1/day 250.00 100 each 12  . memantine (NAMENDA) 5 MG tablet Take 1 tablet (5 mg total) by mouth 2 (two) times daily. 60 tablet 11  . metoprolol succinate (TOPROL-XL) 50 MG 24 hr tablet Take 1 tablet (50 mg total) by mouth daily. Take with or immediately following a meal. 45 tablet 3  . Omega-3 Fatty Acids (FISH OIL) 1200 MG CAPS Take 1 capsule by mouth 2 (two) times daily.      . pioglitazone (ACTOS) 30 MG tablet Take 1 tablet (30 mg total) by mouth daily. 90 tablet 3  . tamsulosin (FLOMAX) 0.4 MG CAPS capsule TAKE 1 CAPSULE BY MOUTH TWO TIMES A DAY 180 capsule 3  . [DISCONTINUED] potassium chloride (K-DUR) 10 MEQ tablet Take 1 tablet (10 mEq total) by mouth daily. 30 tablet 11   No current facility-administered medications on file prior to visit.    Allergies  Allergen Reactions  . Niacin Hives  . Sulfamethoxazole-Trimethoprim Hives  Family History  Problem Relation Age of Onset  . Stroke Mother   . Heart attack Father   . Colon cancer Neg Hx   . Esophageal cancer Neg Hx   . Stomach cancer Neg Hx   . Rectal cancer Neg Hx     BP 132/87 mmHg  Pulse 57  Temp(Src) 98.3 F (36.8 C) (Oral)  Ht 5' 8.5" (1.74 m)  Wt 167 lb (75.751 kg)  BMI 25.02 kg/m2  SpO2 97%  Review of Systems Denies fever and sob, but he has wheezing.      Objective:   Physical Exam Vital signs: see vs page Gen: elderly, frail, no distress head: no deformity eyes: no periorbital swelling, no proptosis external nose and ears are normal mouth: no lesion seen Both eac's and tm's are normal LUNGS:  Clear to auscultation   UA: pos for UTI A1c=5.6%  CXR: NAD    Assessment & Plan:  UTI: new. Acute bronchitis, new DM: well-controlled.     Subjective:   Patient here for Medicare annual wellness visit  and management of other chronic and acute problems.     Risk factors: advanced age    56 of Physicians Providing Medical Care to Patient:  See "snapshot"   Activities of Daily Living: In your present state of health, do you have any difficulty performing the following activities (here with caregiver Alex Robinson, who helps)?:  Preparing food and eating?: No  Bathing yourself: yes Getting dressed: yes Using the toilet: No  Moving around from place to place: No  In the past year have you fallen or had a near fall?:No    Home Safety: Has smoke detector and wears seat belts. No firearms. No excess sun exposure.  Diet and Exercise  Current exercise habits: limited by tremor Dietary issues discussed: Alex Robinson reports pt has a healthy diet   Depression Screen  Q1: Over the past two weeks, have you felt down, depressed or hopeless? no  Q2: Over the past two weeks, have you felt little interest or pleasure in doing things? no   The following portions of the patient's history were reviewed and updated as appropriate: allergies, current medications, past family history, past medical history, past social history, past surgical history and problem list.   Review of Systems  Denies and change in chronic hearing loss, and visual loss (uses glasses and hearing aids) Objective:   Vision:  Sees opthalmologist Hearing: grossly normal with hearing aids Body mass index:  See vs page Msk: pt slowlyly performs "get-up-and-go" from a sitting position Cognitive Impairment Assessment: cognition, memory and judgment appear normal.  remembers 0/3 at 5 minutes.  excellent recall.  Unable to write a sentence, due to tremor.  alert and oriented x 3.    Assessment:   Medicare wellness utd on preventive parameters.     Plan:   During the course of the visit the patient was educated and counseled about appropriate screening and preventive services including:        Fall prevention   Diabetes  screening  Nutrition counseling   Vaccines / LABS Zostavax / Pneumococcal Vaccine  today  PSA  Patient Instructions (the written plan) was given to the patient.

## 2015-02-02 ENCOUNTER — Other Ambulatory Visit: Payer: Medicare HMO

## 2015-02-02 DIAGNOSIS — N3000 Acute cystitis without hematuria: Secondary | ICD-10-CM | POA: Diagnosis not present

## 2015-02-04 LAB — URINE CULTURE: Colony Count: 60000

## 2015-03-11 DIAGNOSIS — E119 Type 2 diabetes mellitus without complications: Secondary | ICD-10-CM | POA: Diagnosis not present

## 2015-05-04 ENCOUNTER — Telehealth: Payer: Self-pay | Admitting: Endocrinology

## 2015-05-04 NOTE — Telephone Encounter (Signed)
i reviewed meds. The 1 that is a potential problem is metoprolol. Please see pt 1 extra time later today, to check vital signs.

## 2015-05-04 NOTE — Telephone Encounter (Signed)
See note below and please advise, Thanks! 

## 2015-05-04 NOTE — Telephone Encounter (Signed)
I contacted Alex Robinson and advised of note below. She voiced understanding and stated she would let us know if anything abnormal comes up.

## 2015-05-04 NOTE — Telephone Encounter (Signed)
[place] aide is calling to let us know he thinks he took too many of his AM meds, does not know for sure if he took it or just lost it and does not know the name of the med [phone] [facility]

## 2015-05-28 NOTE — Progress Notes (Signed)
   Subjective:    Patient ID: Alex Robinson, male    DOB: 01/30/1926, 82 y.o.   MRN: [id number]  HPI Pt returns for f/u of diabetes mellitus: DM type: 2 Dx'ed: [id number] Complications: renal insufficiency Therapy: pioglitizone DKA: never Severe hypoglycemia: never Pancreatitis: never Other: renal insufficiency limits oral med options Interval history: [employer]), says memory is better after taking aricept, but still not good.     Review of Systems     Objective:   Physical Exam        Assessment & Plan:   This encounter was created in error - please disregard.

## 2015-05-30 ENCOUNTER — Encounter: Payer: Private Health Insurance - Indemnity | Admitting: Endocrinology

## 2015-06-06 ENCOUNTER — Other Ambulatory Visit: Payer: Self-pay | Admitting: Endocrinology

## 2015-06-10 DIAGNOSIS — E119 Type 2 diabetes mellitus without complications: Secondary | ICD-10-CM | POA: Diagnosis not present

## 2015-07-03 ENCOUNTER — Other Ambulatory Visit: Payer: Self-pay | Admitting: Endocrinology

## 2015-08-27 ENCOUNTER — Other Ambulatory Visit: Payer: Self-pay | Admitting: Endocrinology

## 2015-10-14 ENCOUNTER — Other Ambulatory Visit: Payer: Self-pay | Admitting: Endocrinology

## 2015-11-14 ENCOUNTER — Other Ambulatory Visit: Payer: Self-pay

## 2015-11-14 MED ORDER — METOPROLOL SUCCINATE ER 50 MG PO TB24: ORAL_TABLET | ORAL | 1 refills | Status: DC

## 2015-11-19 ENCOUNTER — Other Ambulatory Visit: Payer: Self-pay | Admitting: Endocrinology

## 2015-11-19 NOTE — Telephone Encounter (Signed)
Please refill x 1 Ov is due  

## 2015-11-24 ENCOUNTER — Other Ambulatory Visit: Payer: Self-pay

## 2015-11-29 ENCOUNTER — Other Ambulatory Visit: Payer: Self-pay | Admitting: Endocrinology

## 2015-11-29 NOTE — Telephone Encounter (Signed)
Please refill x 1 Ov is due  

## 2015-12-27 ENCOUNTER — Other Ambulatory Visit: Payer: Self-pay | Admitting: Endocrinology

## 2015-12-27 NOTE — Telephone Encounter (Signed)
Please refill x 1 Ov is due  

## 2015-12-31 ENCOUNTER — Other Ambulatory Visit: Payer: Self-pay | Admitting: Endocrinology

## 2015-12-31 NOTE — Telephone Encounter (Signed)
Please refill x 1 Ov is due  

## 2016-02-01 ENCOUNTER — Other Ambulatory Visit: Payer: Self-pay | Admitting: Endocrinology

## 2016-02-01 NOTE — Telephone Encounter (Signed)
Please refill x 3 mos Ov is due 

## 2016-02-07 ENCOUNTER — Other Ambulatory Visit: Payer: Self-pay | Admitting: Endocrinology

## 2016-02-07 NOTE — Telephone Encounter (Signed)
Please refill x 3 mos Ov is due 

## 2016-02-25 ENCOUNTER — Other Ambulatory Visit: Payer: Self-pay | Admitting: Endocrinology

## 2016-02-25 NOTE — Telephone Encounter (Signed)
Please refill x 1 Ov is due  

## 2016-04-21 ENCOUNTER — Other Ambulatory Visit: Payer: Self-pay | Admitting: Endocrinology

## 2016-04-21 NOTE — Telephone Encounter (Signed)
Please refill x 1 Ov is due  

## 2016-05-01 ENCOUNTER — Other Ambulatory Visit: Payer: Self-pay | Admitting: Endocrinology

## 2016-05-02 NOTE — Telephone Encounter (Signed)
Please refill x 1 Ov is due  

## 2016-05-08 ENCOUNTER — Encounter: Payer: Self-pay | Admitting: Endocrinology

## 2016-05-08 ENCOUNTER — Other Ambulatory Visit: Payer: Self-pay | Admitting: Endocrinology

## 2016-05-08 NOTE — Telephone Encounter (Signed)
Please refill x 1  

## 2016-05-10 ENCOUNTER — Telehealth: Payer: Self-pay | Admitting: Endocrinology

## 2016-05-10 NOTE — Telephone Encounter (Signed)
Patient dismissed from [facility] by Renato Shin MD , effective May 08, 2016. Dismissal letter sent out by certified / registered mail.  DAJ

## 2016-05-13 NOTE — Telephone Encounter (Signed)
Patient family member call upset about Dr Loanne Drilling dismissing patient, she talked very ugly to stephanie, and called back and talked ugly to me as well I, I talked to Dr Loanne Drilling and he said he would not see this patient anymore and told me why, I relayed this message to patient family member. She started spewing out threat at me so I politely ended the call.

## 2016-05-14 ENCOUNTER — Other Ambulatory Visit: Payer: Self-pay | Admitting: Endocrinology

## 2016-05-15 NOTE — Telephone Encounter (Signed)
Received signed domestic return receipt verifying delivery of certified letter on March 19. 2018. Article number [id number] [place]

## 2016-05-25 ENCOUNTER — Other Ambulatory Visit: Payer: Self-pay | Admitting: Internal Medicine

## 2016-05-27 NOTE — Telephone Encounter (Signed)
30 day supply submitted. Dismissal letter was sent out on 05/08/2016, but the patient is still covered for medication refills until 30 days after the letter was sent.

## 2016-05-27 NOTE — Telephone Encounter (Signed)
He is a Dr.Ellison patient.

## 2016-06-12 DIAGNOSIS — B351 Tinea unguium: Secondary | ICD-10-CM | POA: Diagnosis not present

## 2016-06-12 DIAGNOSIS — I10 Essential (primary) hypertension: Secondary | ICD-10-CM | POA: Diagnosis not present

## 2016-06-12 DIAGNOSIS — G309 Alzheimer's disease, unspecified: Secondary | ICD-10-CM | POA: Diagnosis not present

## 2016-06-12 DIAGNOSIS — E784 Other hyperlipidemia: Secondary | ICD-10-CM | POA: Diagnosis not present

## 2016-06-12 DIAGNOSIS — E119 Type 2 diabetes mellitus without complications: Secondary | ICD-10-CM | POA: Diagnosis not present

## 2016-06-16 ENCOUNTER — Other Ambulatory Visit: Payer: Self-pay | Admitting: Endocrinology

## 2016-06-27 ENCOUNTER — Ambulatory Visit (INDEPENDENT_AMBULATORY_CARE_PROVIDER_SITE_OTHER): Payer: Medicare HMO | Admitting: Podiatry

## 2016-06-27 ENCOUNTER — Encounter: Payer: Self-pay | Admitting: Podiatry

## 2016-06-27 VITALS — BP 182/75 | HR 52 | Ht 69.0 in | Wt 173.0 lb

## 2016-06-27 DIAGNOSIS — B351 Tinea unguium: Secondary | ICD-10-CM | POA: Diagnosis not present

## 2016-06-27 DIAGNOSIS — M79671 Pain in right foot: Secondary | ICD-10-CM | POA: Diagnosis not present

## 2016-06-27 DIAGNOSIS — M79672 Pain in left foot: Secondary | ICD-10-CM

## 2016-06-27 NOTE — Progress Notes (Signed)
SUBJECTIVE: 83 y.o. year old male presents for diabetic foot care. Patient was accompanied by his daughter and her husband. Patient is hard of hearing.  He was put on Lamisil for fungal nail.   REVIEW OF SYSTEMS: Pertinent items noted in HPI and remainder of comprehensive ROS otherwise negative.  OBJECTIVE: DERMATOLOGIC EXAMINATION: Thick dystrophic nails on both great toes.  VASCULAR EXAMINATION OF LOWER LIMBS: Pedal pulses are not palpable on both Dorsalis Pedis bilateral. Posterior tibial pulses are faintly palpable.  Positive of mild forefoot edema noted. Capillary Filling times within 3 seconds in all digits.  Temperature gradient from tibial crest to dorsum of foot is within normal bilateral.  NEUROLOGIC EXAMINATION OF THE LOWER LIMBS: Achilles DTR is present and within normal. Monofilament (Semmes-Weinstein 10-gm) sensory testing positive 6 out of 6, bilateral. Vibratory sensations(128Hz  turning fork) intact at medial and lateral forefoot bilateral.  Sharp and Dull discriminatory sensations at the plantar ball of hallux is intact bilateral.   MUSCULOSKELETAL EXAMINATION: Positive for hyperextended great toe bilateral.  ASSESSMENT: Painful onychogryphosis both great toe nails. Compromised peripheral vascularity both feet.  PLAN: Reviewed findings and available treatment options. All nails debrided. Return in 3 months for palliation.

## 2016-06-27 NOTE — Patient Instructions (Signed)
Seen for hypertrophic nails. All nails debrided. Return in 3 months or as needed.  

## 2016-07-24 DIAGNOSIS — N400 Enlarged prostate without lower urinary tract symptoms: Secondary | ICD-10-CM | POA: Diagnosis not present

## 2016-07-24 DIAGNOSIS — I10 Essential (primary) hypertension: Secondary | ICD-10-CM | POA: Diagnosis not present

## 2016-07-24 DIAGNOSIS — E119 Type 2 diabetes mellitus without complications: Secondary | ICD-10-CM | POA: Diagnosis not present

## 2016-07-24 DIAGNOSIS — G309 Alzheimer's disease, unspecified: Secondary | ICD-10-CM | POA: Diagnosis not present

## 2016-08-22 DIAGNOSIS — H5213 Myopia, bilateral: Secondary | ICD-10-CM | POA: Diagnosis not present

## 2016-08-22 DIAGNOSIS — H524 Presbyopia: Secondary | ICD-10-CM | POA: Diagnosis not present

## 2016-08-22 DIAGNOSIS — H5203 Hypermetropia, bilateral: Secondary | ICD-10-CM | POA: Diagnosis not present

## 2016-08-22 DIAGNOSIS — E119 Type 2 diabetes mellitus without complications: Secondary | ICD-10-CM | POA: Diagnosis not present

## 2016-08-22 DIAGNOSIS — H52223 Regular astigmatism, bilateral: Secondary | ICD-10-CM | POA: Diagnosis not present

## 2016-08-22 DIAGNOSIS — H26492 Other secondary cataract, left eye: Secondary | ICD-10-CM | POA: Diagnosis not present

## 2016-08-22 DIAGNOSIS — Z961 Presence of intraocular lens: Secondary | ICD-10-CM | POA: Diagnosis not present

## 2016-08-22 LAB — HM DIABETES EYE EXAM

## 2016-09-26 ENCOUNTER — Ambulatory Visit (INDEPENDENT_AMBULATORY_CARE_PROVIDER_SITE_OTHER): Payer: Medicare HMO | Admitting: Podiatry

## 2016-09-26 ENCOUNTER — Encounter: Payer: Self-pay | Admitting: Podiatry

## 2016-09-26 DIAGNOSIS — M79671 Pain in right foot: Secondary | ICD-10-CM | POA: Diagnosis not present

## 2016-09-26 DIAGNOSIS — B351 Tinea unguium: Secondary | ICD-10-CM

## 2016-09-26 DIAGNOSIS — M79672 Pain in left foot: Secondary | ICD-10-CM

## 2016-09-26 NOTE — Progress Notes (Signed)
SUBJECTIVE: 83 y.o. year old male presents for diabetic foot care. Patient was accompanied by his son. Patient is hard of hearing.   OBJECTIVE: DERMATOLOGIC EXAMINATION: Thick dystrophic nails on both great toes.  VASCULAR EXAMINATION OF LOWER LIMBS: Pedal pulses are not palpable on both Dorsalis Pedis bilateral. Posterior tibial pulses are faintly palpable.   NEUROLOGIC EXAMINATION OF THE LOWER LIMBS: All epicritic and tactile sensations grossly intact.  MUSCULOSKELETAL EXAMINATION: Positive for hyperextended great toe bilateral.  ASSESSMENT: Painful onychogryphosis both great toe nails. Compromised peripheral vascularity both feet.  PLAN: Reviewed findings and available treatment options. All nails debrided. Both great toe nails grinded. Return in 3 months for palliation.

## 2016-09-26 NOTE — Patient Instructions (Signed)
Seen for hypertrophic nails. All nails debrided. Return in 3 months or as needed.  

## 2016-10-16 ENCOUNTER — Other Ambulatory Visit: Payer: Self-pay | Admitting: Endocrinology

## 2016-10-16 NOTE — Telephone Encounter (Signed)
Ok to refill patient hasn't been seen since 2016?

## 2016-12-26 ENCOUNTER — Encounter: Payer: Self-pay | Admitting: Podiatry

## 2016-12-26 ENCOUNTER — Ambulatory Visit (INDEPENDENT_AMBULATORY_CARE_PROVIDER_SITE_OTHER): Payer: Medicare HMO | Admitting: Podiatry

## 2016-12-26 DIAGNOSIS — B351 Tinea unguium: Secondary | ICD-10-CM

## 2016-12-26 DIAGNOSIS — M79672 Pain in left foot: Secondary | ICD-10-CM

## 2016-12-26 DIAGNOSIS — M79671 Pain in right foot: Secondary | ICD-10-CM

## 2016-12-26 NOTE — Progress Notes (Signed)
Subjective: 83 y.o. year old male patient presents complaining of painful nails. Patient requests toe nails trimmed.  Patient came in with a care taker who stays with Alex Robinson for 5 hours a day.  Patient has a hard of hearing with tremor in both hands.  Objective: Dermatologic: Thick yellow dystrophic hallucal nails both feet. Vascular: Pedal pulses are all palpable. Positive of mild lower limb edema bilateral. Orthopedic: Contracted lesser digits  Neurologic: Hyperextended both great toe at IPJ.  Assessment: Dystrophic mycotic nails x 10.  Treatment: All mycotic debrided and grinded to remove excess thickness. Return in 3 months or as needed.

## 2016-12-26 NOTE — Patient Instructions (Signed)
Seen for hypertrophic nails. All nails debrided. Return in 3 months or as needed.  

## 2017-01-20 DIAGNOSIS — J069 Acute upper respiratory infection, unspecified: Secondary | ICD-10-CM | POA: Diagnosis not present

## 2017-01-20 DIAGNOSIS — E119 Type 2 diabetes mellitus without complications: Secondary | ICD-10-CM | POA: Diagnosis not present

## 2017-01-20 DIAGNOSIS — K5289 Other specified noninfective gastroenteritis and colitis: Secondary | ICD-10-CM | POA: Diagnosis not present

## 2017-01-20 DIAGNOSIS — I10 Essential (primary) hypertension: Secondary | ICD-10-CM | POA: Diagnosis not present

## 2017-01-28 DIAGNOSIS — G309 Alzheimer's disease, unspecified: Secondary | ICD-10-CM | POA: Diagnosis not present

## 2017-01-28 DIAGNOSIS — E119 Type 2 diabetes mellitus without complications: Secondary | ICD-10-CM | POA: Diagnosis not present

## 2017-01-28 DIAGNOSIS — J209 Acute bronchitis, unspecified: Secondary | ICD-10-CM | POA: Diagnosis not present

## 2017-01-28 DIAGNOSIS — Z23 Encounter for immunization: Secondary | ICD-10-CM | POA: Diagnosis not present

## 2017-01-28 DIAGNOSIS — I10 Essential (primary) hypertension: Secondary | ICD-10-CM | POA: Diagnosis not present

## 2017-02-12 DIAGNOSIS — L03039 Cellulitis of unspecified toe: Secondary | ICD-10-CM | POA: Diagnosis not present

## 2017-02-12 DIAGNOSIS — G309 Alzheimer's disease, unspecified: Secondary | ICD-10-CM | POA: Diagnosis not present

## 2017-02-12 DIAGNOSIS — E119 Type 2 diabetes mellitus without complications: Secondary | ICD-10-CM | POA: Diagnosis not present

## 2017-02-12 DIAGNOSIS — I10 Essential (primary) hypertension: Secondary | ICD-10-CM | POA: Diagnosis not present

## 2017-02-20 DIAGNOSIS — G5602 Carpal tunnel syndrome, left upper limb: Secondary | ICD-10-CM | POA: Diagnosis not present

## 2017-02-20 DIAGNOSIS — G5601 Carpal tunnel syndrome, right upper limb: Secondary | ICD-10-CM | POA: Diagnosis not present

## 2017-04-03 ENCOUNTER — Ambulatory Visit: Payer: Medicare HMO | Admitting: Podiatry

## 2019-04-23 ENCOUNTER — Ambulatory Visit: Payer: Medicare HMO | Attending: Internal Medicine

## 2019-04-23 DIAGNOSIS — Z23 Encounter for immunization: Secondary | ICD-10-CM | POA: Insufficient documentation

## 2019-04-23 NOTE — Progress Notes (Signed)
   Covid-19 Vaccination Clinic  Name:  Alex Robinson    MRN: [id number] DOB: 11/05/1925  04/23/2019  [name] was observed post Covid-19 immunization for 15 minutes without incidence. He was provided with Vaccine Information Sheet and instruction to access the V-Safe system.   [name] was instructed to call 911 with any severe reactions post vaccine: [name] Kitchen Difficulty breathing  . Swelling of your face and throat  . A fast heartbeat  . A bad rash all over your body  . Dizziness and weakness    Immunizations Administered    Name Date Dose VIS Date Route   Pfizer COVID-19 Vaccine 04/23/2019  8:49 AM 0.3 mL 02/05/2019 Intramuscular   Manufacturer: [place]   Lot: [id number]   [place]: [id number]

## 2019-05-18 ENCOUNTER — Ambulatory Visit: Payer: Medicare HMO | Attending: Internal Medicine

## 2019-05-18 DIAGNOSIS — Z23 Encounter for immunization: Secondary | ICD-10-CM

## 2019-05-18 NOTE — Progress Notes (Signed)
   Covid-19 Vaccination Clinic  Name:  BRANDON SIMILIEN    MRN: [id number] DOB: 10/02/1925  05/18/2019  Mr. Yap was observed post Covid-19 immunization for 15 minutes without incident. He was provided with Vaccine Information Sheet and instruction to access the V-Safe system.   Mr. Jerabek was instructed to call 911 with any severe reactions post vaccine: [name] Kitchen Difficulty breathing  . Swelling of face and throat  . A fast heartbeat  . A bad rash all over body  . Dizziness and weakness   Immunizations Administered    Name Date Dose VIS Date Route   Pfizer COVID-19 Vaccine 05/18/2019 10:04 AM 0.3 mL 02/05/2019 Intramuscular   Manufacturer: [place]   Lot: [id number]   [place]: [id number]

## 2020-04-04 ENCOUNTER — Other Ambulatory Visit: Payer: Self-pay | Admitting: Internal Medicine

## 2020-04-05 LAB — COMPLETE METABOLIC PANEL WITH GFR
AG Ratio: 1.4 (calc) (ref 1.0–2.5)
ALT: 22 U/L (ref 9–46)
AST: 24 U/L (ref 10–35)
Albumin: 4.2 g/dL (ref 3.6–5.1)
Alkaline phosphatase (APISO): 90 U/L (ref 35–144)
BUN/Creatinine Ratio: 23 (calc) — ABNORMAL HIGH (ref 6–22)
BUN: 31 mg/dL — ABNORMAL HIGH (ref 7–25)
CO2: 28 mmol/L (ref 20–32)
Calcium: 9.8 mg/dL (ref 8.6–10.3)
Chloride: 102 mmol/L (ref 98–110)
Creat: 1.37 mg/dL — ABNORMAL HIGH (ref 0.70–1.11)
GFR, Est African American: 52 mL/min/{1.73_m2} — ABNORMAL LOW (ref >=60)
GFR, Est Non African American: 44 mL/min/{1.73_m2} — ABNORMAL LOW (ref >=60)
Globulin: 2.9 g/dL (calc) (ref 1.9–3.7)
Glucose, Bld: 81 mg/dL (ref 65–99)
Potassium: 4.8 mmol/L (ref 3.5–5.3)
Sodium: 142 mmol/L (ref 135–146)
Total Bilirubin: 0.7 mg/dL (ref 0.2–1.2)
Total Protein: 7.1 g/dL (ref 6.1–8.1)

## 2020-04-05 LAB — LIPID PANEL
Cholesterol: 139 mg/dL (ref <200)
HDL: 38 mg/dL — ABNORMAL LOW (ref >=40)
LDL Cholesterol (Calc): 78 mg/dL (calc)
Non-HDL Cholesterol (Calc): 101 mg/dL (calc) (ref <130)
Total CHOL/HDL Ratio: 3.7 (calc) (ref <5.0)
Triglycerides: 130 mg/dL (ref <150)

## 2020-04-05 LAB — CBC
HCT: 43.1 % (ref 38.5–50.0)
Hemoglobin: 14.2 g/dL (ref 13.2–17.1)
MCH: 30.4 pg (ref 27.0–33.0)
MCHC: 32.9 g/dL (ref 32.0–36.0)
MCV: 92.3 fL (ref 80.0–100.0)
MPV: 10.8 fL (ref 7.5–12.5)
Platelets: 278 10*3/uL (ref 140–400)
RBC: 4.67 10*6/uL (ref 4.20–5.80)
RDW: 12.6 % (ref 11.0–15.0)
WBC: 6.5 10*3/uL (ref 3.8–10.8)

## 2020-04-05 LAB — TSH: TSH: 2.48 mIU/L (ref 0.40–4.50)

## 2020-04-05 LAB — URINE CULTURE
MICRO NUMBER:: 11508671
SPECIMEN QUALITY:: ADEQUATE

## 2021-03-05 ENCOUNTER — Emergency Department (HOSPITAL_COMMUNITY): Payer: Medicare HMO

## 2021-03-05 ENCOUNTER — Inpatient Hospital Stay (HOSPITAL_COMMUNITY)
Admission: EM | Admit: 2021-03-05 | Discharge: 2021-03-08 | DRG: 297 | Disposition: A | Discharge status: Home Health | Payer: Medicare HMO | Attending: Cardiovascular Disease | Admitting: Cardiovascular Disease

## 2021-03-05 ENCOUNTER — Other Ambulatory Visit: Payer: Self-pay

## 2021-03-05 DIAGNOSIS — K219 Gastro-esophageal reflux disease without esophagitis: Secondary | ICD-10-CM | POA: Diagnosis present

## 2021-03-05 DIAGNOSIS — Z85828 Personal history of other malignant neoplasm of skin: Secondary | ICD-10-CM

## 2021-03-05 DIAGNOSIS — Z20822 Contact with and (suspected) exposure to covid-19: Secondary | ICD-10-CM | POA: Diagnosis present

## 2021-03-05 DIAGNOSIS — F03B0 Unspecified dementia, moderate, without behavioral disturbance, psychotic disturbance, mood disturbance, and anxiety: Secondary | ICD-10-CM | POA: Diagnosis present

## 2021-03-05 DIAGNOSIS — E7800 Pure hypercholesterolemia, unspecified: Secondary | ICD-10-CM | POA: Diagnosis present

## 2021-03-05 DIAGNOSIS — Z79899 Other long term (current) drug therapy: Secondary | ICD-10-CM | POA: Diagnosis not present

## 2021-03-05 DIAGNOSIS — E119 Type 2 diabetes mellitus without complications: Secondary | ICD-10-CM | POA: Diagnosis present

## 2021-03-05 DIAGNOSIS — Z8249 Family history of ischemic heart disease and other diseases of the circulatory system: Secondary | ICD-10-CM | POA: Diagnosis not present

## 2021-03-05 DIAGNOSIS — Z87891 Personal history of nicotine dependence: Secondary | ICD-10-CM | POA: Diagnosis not present

## 2021-03-05 DIAGNOSIS — J45909 Unspecified asthma, uncomplicated: Secondary | ICD-10-CM | POA: Diagnosis present

## 2021-03-05 DIAGNOSIS — I469 Cardiac arrest, cause unspecified: Secondary | ICD-10-CM | POA: Diagnosis present

## 2021-03-05 DIAGNOSIS — F03B4 Unspecified dementia, moderate, with anxiety: Secondary | ICD-10-CM | POA: Diagnosis present

## 2021-03-05 DIAGNOSIS — Z882 Allergy status to sulfonamides status: Secondary | ICD-10-CM

## 2021-03-05 DIAGNOSIS — Z7982 Long term (current) use of aspirin: Secondary | ICD-10-CM

## 2021-03-05 DIAGNOSIS — Z9049 Acquired absence of other specified parts of digestive tract: Secondary | ICD-10-CM | POA: Diagnosis not present

## 2021-03-05 DIAGNOSIS — R55 Syncope and collapse: Secondary | ICD-10-CM | POA: Diagnosis present

## 2021-03-05 DIAGNOSIS — G250 Essential tremor: Secondary | ICD-10-CM | POA: Diagnosis present

## 2021-03-05 DIAGNOSIS — I4510 Unspecified right bundle-branch block: Secondary | ICD-10-CM | POA: Diagnosis present

## 2021-03-05 DIAGNOSIS — I10 Essential (primary) hypertension: Secondary | ICD-10-CM | POA: Diagnosis present

## 2021-03-05 DIAGNOSIS — Z888 Allergy status to other drugs, medicaments and biological substances status: Secondary | ICD-10-CM

## 2021-03-05 LAB — COMPREHENSIVE METABOLIC PANEL
ALT: 50 U/L — ABNORMAL HIGH (ref 0–44)
AST: 51 U/L — ABNORMAL HIGH (ref 15–41)
Albumin: 3.8 g/dL (ref 3.5–5.0)
Alkaline Phosphatase: 102 U/L (ref 38–126)
Anion gap: 9 (ref 5–15)
BUN: 28 mg/dL — ABNORMAL HIGH (ref 8–23)
CO2: 27 mmol/L (ref 22–32)
Calcium: 8.9 mg/dL (ref 8.9–10.3)
Chloride: 106 mmol/L (ref 98–111)
Creatinine, Ser: 1.5 mg/dL — ABNORMAL HIGH (ref 0.61–1.24)
GFR, Estimated: 43 mL/min — ABNORMAL LOW (ref >60)
Glucose, Bld: 127 mg/dL — ABNORMAL HIGH (ref 70–99)
Potassium: 3.7 mmol/L (ref 3.5–5.1)
Sodium: 142 mmol/L (ref 135–145)
Total Bilirubin: 0.7 mg/dL (ref 0.3–1.2)
Total Protein: 7.5 g/dL (ref 6.5–8.1)

## 2021-03-05 LAB — RESP PANEL BY RT-PCR (FLU A&B, COVID) ARPGX2
Influenza A by PCR: NEGATIVE
Influenza B by PCR: NEGATIVE
SARS Coronavirus 2 by RT PCR: NEGATIVE

## 2021-03-05 LAB — TROPONIN I (HIGH SENSITIVITY)
Troponin I (High Sensitivity): 38 ng/L — ABNORMAL HIGH (ref <18)
Troponin I (High Sensitivity): 41 ng/L — ABNORMAL HIGH (ref <18)

## 2021-03-05 LAB — CBG MONITORING, ED
Glucose-Capillary: 107 mg/dL — ABNORMAL HIGH (ref 70–99)
Glucose-Capillary: 105 mg/dL — ABNORMAL HIGH (ref 70–99)

## 2021-03-05 LAB — CBC
HCT: 44.9 % (ref 39.0–52.0)
Hemoglobin: 14.6 g/dL (ref 13.0–17.0)
MCH: 31 pg (ref 26.0–34.0)
MCHC: 32.5 g/dL (ref 30.0–36.0)
MCV: 95.3 fL (ref 80.0–100.0)
Platelets: 256 10*3/uL (ref 150–400)
RBC: 4.71 MIL/uL (ref 4.22–5.81)
RDW: 13.9 % (ref 11.5–15.5)
WBC: 12.3 10*3/uL — ABNORMAL HIGH (ref 4.0–10.5)
nRBC: 0 % (ref 0.0–0.2)

## 2021-03-05 LAB — MAGNESIUM: Magnesium: 2.1 mg/dL (ref 1.7–2.4)

## 2021-03-05 MED ORDER — ACETAMINOPHEN 650 MG RE SUPP: 650.0000 mg | Freq: Four times a day (QID) | RECTAL | Status: DC | PRN

## 2021-03-05 MED ORDER — SODIUM CHLORIDE 0.9 % IV SOLN: 250.0000 mL | INTRAVENOUS | Status: DC | PRN

## 2021-03-05 MED ORDER — DOCUSATE SODIUM 100 MG PO CAPS
100.0000 mg | ORAL_CAPSULE | Freq: Two times a day (BID) | ORAL | Status: DC
  Administered 2021-03-06 – 2021-03-08 (×4): 100 mg via ORAL
  Filled 2021-03-05 (×4): qty 1

## 2021-03-05 MED ORDER — INSULIN ASPART 100 UNIT/ML IJ SOLN
0.0000 [IU] | Freq: Three times a day (TID) | INTRAMUSCULAR | Status: DC
  Administered 2021-03-06: 1 [IU] via SUBCUTANEOUS

## 2021-03-05 MED ORDER — ACETAMINOPHEN 325 MG PO TABS: 650.0000 mg | ORAL_TABLET | Freq: Four times a day (QID) | ORAL | Status: DC | PRN

## 2021-03-05 MED ORDER — HEPARIN SODIUM (PORCINE) 5000 UNIT/ML IJ SOLN
5000.0000 [IU] | Freq: Three times a day (TID) | INTRAMUSCULAR | Status: DC
  Administered 2021-03-05 – 2021-03-08 (×8): 5000 [IU] via SUBCUTANEOUS
  Filled 2021-03-05 (×8): qty 1

## 2021-03-05 MED ORDER — SODIUM CHLORIDE 0.9% FLUSH
3.0000 mL | Freq: Two times a day (BID) | INTRAVENOUS | Status: DC
  Administered 2021-03-05 – 2021-03-08 (×4): 3 mL via INTRAVENOUS

## 2021-03-05 MED ORDER — SODIUM CHLORIDE 0.9% FLUSH: 3.0000 mL | INTRAVENOUS | Status: DC | PRN

## 2021-03-05 NOTE — ED Notes (Addendum)
Pt placed back on monitor - pt alert to self only - pt removed one IV on accident - pt covered up back in bed

## 2021-03-05 NOTE — ED Notes (Signed)
MD Doylene Canard notified that pt still with irregular HR - pt is sleeping with HR in the 55-60 range

## 2021-03-05 NOTE — H&P (Signed)
Referring Physician: Isla Pence, MD  Alex Robinson is an 88 y.o. male.                       Chief Complaint: Syncope/Cardiac arrest  HPI: 88 years old white male with PMH of Asthma, type 2 DM, HTN, HLD, tremors and mild dementia had several episodes of syncope followed by cardiac arrest. He had multiple rounds of cardiac arrest, CPR and ROSC. He is awake now. He denies chest pain. EKG shows SR, LAHB and RBBB. Chest x-ray showed cardiomegaly otherwise unremarkable.  Past Medical History:  Diagnosis Date   ALLERGIC RHINITIS 09/22/2006   ANEMIA-NOS 09/22/2006   Asthma    as a child   CARCINOMA, SKIN, SQUAMOUS CELL 01/04/2009   Cough 02/01/2008   DIABETES MELLITUS, TYPE II 09/22/2006   DIVERTICULITIS, HX OF 09/22/2006   GERD 09/22/2006   HYPERLIPIDEMIA 09/22/2006   HYPERTENSION 09/22/2006   Impacted cerumen 11/06/2007   INGUINAL HERNIA, RIGHT 12/20/2008   Neuromuscular disorder ([place])    tremor- hands   OSTEOARTHRITIS 11/06/2007   PSA, INCREASED 11/06/2007   RECTAL BLEEDING 04/04/2010   TREMOR 04/23/2007   in hands   URINARY RETENTION 04/04/2010   Vitiligo 11/06/2007      Past Surgical History:  Procedure Laterality Date   APPENDECTOMY  1946   CATARACT EXTRACTION     INNER EAR SURGERY  1960   TONSILLECTOMY AND ADENOIDECTOMY  1939    Family History  Problem Relation Age of Onset   Stroke Mother    Heart attack Father    Colon cancer Neg Hx    Esophageal cancer Neg Hx    Stomach cancer Neg Hx    Rectal cancer Neg Hx    Social History:  reports that he has quit smoking. He has never used smokeless tobacco. He reports that he does not drink alcohol and does not use drugs.  Allergies:  Allergies  Allergen Reactions   Niacin Hives   Sulfamethoxazole-Trimethoprim Hives    (Not in a hospital admission)   Results for orders placed or performed during the hospital encounter of 03/05/21 (from the past 48 hour(s))  CBC     Status: Abnormal   Collection Time: 03/05/21  1:50 PM   Result Value Ref Range   WBC 12.3 (H) 4.0 - 10.5 K/uL   RBC 4.71 4.22 - 5.81 MIL/uL   Hemoglobin 14.6 13.0 - 17.0 g/dL   HCT 44.9 39.0 - 52.0 %   MCV 95.3 80.0 - 100.0 fL   MCH 31.0 26.0 - 34.0 pg   MCHC 32.5 30.0 - 36.0 g/dL   RDW 13.9 11.5 - 15.5 %   Platelets 256 150 - 400 K/uL   nRBC 0.0 0.0 - 0.2 %    Comment: Performed at [facility], [place] [address], [place], [place] [address]  Resp Panel by RT-PCR (Flu A&B, Covid) Nasopharyngeal Swab     Status: None   Collection Time: 03/05/21  1:50 PM   Specimen: Nasopharyngeal Swab; Nasopharyngeal(NP) swabs in vial transport medium  Result Value Ref Range   SARS Coronavirus 2 by RT PCR NEGATIVE NEGATIVE    Comment: (NOTE) SARS-CoV-2 target nucleic acids are NOT DETECTED.  The SARS-CoV-2 RNA is generally detectable in upper respiratory specimens during the acute phase of infection. The lowest concentration of SARS-CoV-2 viral copies this assay can detect is 138 copies/mL. A negative result does not preclude SARS-Cov-2 infection and should not be used as the sole  basis for treatment or other patient management decisions. A negative result may occur with  improper specimen collection/handling, submission of specimen other than nasopharyngeal swab, presence of viral mutation(s) within the areas targeted by this assay, and inadequate number of viral copies(<138 copies/mL). A negative result must be combined with clinical observations, patient history, and epidemiological information. The expected result is Negative.  Fact Sheet for Patients:  EntrepreneurPulse.com.au  Fact Sheet for Healthcare Providers:  IncredibleEmployment.be  This test is no t yet approved or cleared by the [place] FDA and  has been authorized for detection and/or diagnosis of SARS-CoV-2 by FDA under an Emergency Use Authorization (EUA). This EUA will remain  in effect (meaning this test can be used) for the duration  of the COVID-19 declaration under Section 564(b)(1) of the Act, 21 U.S.C.section 360bbb-3(b)(1), unless the authorization is terminated  or revoked sooner.       Influenza A by PCR NEGATIVE NEGATIVE   Influenza B by PCR NEGATIVE NEGATIVE    Comment: (NOTE) The Xpert Xpress SARS-CoV-2/FLU/RSV plus assay is intended as an aid in the diagnosis of influenza from Nasopharyngeal swab specimens and should not be used as a sole basis for treatment. Nasal washings and aspirates are unacceptable for Xpert Xpress SARS-CoV-2/FLU/RSV testing.  Fact Sheet for Patients: EntrepreneurPulse.com.au  Fact Sheet for Healthcare Providers: IncredibleEmployment.be  This test is not yet approved or cleared by the [place] FDA and has been authorized for detection and/or diagnosis of SARS-CoV-2 by FDA under an Emergency Use Authorization (EUA). This EUA will remain in effect (meaning this test can be used) for the duration of the COVID-19 declaration under Section 564(b)(1) of the Act, 21 U.S.C. section 360bbb-3(b)(1), unless the authorization is terminated or revoked.  Performed at [facility], [place] [address] [place], [place] [address]    DG Chest Port 1 View  Result Date: 03/05/2021 CLINICAL DATA:  Status post cardiac arrest EXAM: PORTABLE CHEST 1 VIEW COMPARISON:  02/01/2015 FINDINGS: Transverse diameter of heart is increased. Thoracic aorta is tortuous and ectatic. There are no signs of pulmonary edema or focal pulmonary consolidation. There is no pleural effusion or pneumothorax. IMPRESSION: Cardiomegaly. There are no signs of pulmonary edema or focal pulmonary consolidation. Electronically Signed   By: Elmer Picker M.D.   On: 03/05/2021 14:00    Review Of Systems Constitutional: No fever, chills, weight loss or gain. Eyes: No vision change, wears glasses. No discharge or pain. Ears: Positive hearing loss, No tinnitus. Respiratory: No asthma,  COPD, pneumonias. No shortness of breath. No hemoptysis. Cardiovascular: No chest pain, palpitation, leg edema. Gastrointestinal: No nausea, vomiting, diarrhea, constipation. No GI bleed. No hepatitis. Genitourinary: No dysuria, hematuria, kidney stone. No incontinance. Neurological: No headache, stroke, seizures.  Psychiatry: No psych facility admission for anxiety, depression, suicide. No detox. Skin: No rash. Musculoskeletal: Positive joint pain, no fibromyalgia. No neck pain, back pain. Lymphadenopathy: No lymphadenopathy. Hematology: No anemia or easy bruising.   Blood pressure (!) 176/86, pulse 69, temperature 98.2 F (36.8 C), temperature source Oral, resp. rate 11, height 5\' 9"  (1.753 m), weight 78 kg, SpO2 96 %. Body mass index is 25.39 kg/m. General appearance: alert, cooperative, appears stated age and no distress Head: Normocephalic, atraumatic. Eyes: Blue eyes, pink conjunctiva, corneas clear.  Neck: No adenopathy, no carotid bruit, no JVD, supple, symmetrical, trachea midline and thyroid not enlarged. Resp: Clear to auscultation bilaterally. Cardio: Regular rate and rhythm, S1, S2 normal, II/VI systolic murmur, no click, rub or gallop GI:  Soft, non-tender; bowel sounds normal; no organomegaly. Extremities: No edema, cyanosis or clubbing. Skin: Warm and dry.  Neurologic: Alert and oriented X 1, normal strength.   Assessment/Plan Syncope Cardiac arrest survivor HTN HLD Type 2 DM Mild dementia  Plan: Admit. Monitor for cardiac rhythm. Check blood work result when available. Echocardiogram. Review home medications.  Time spent: Review of old records, Lab, x-rays, EKG, other cardiac tests, examination, discussion with patient/Family/Doctor over 70 minutes.  Birdie Riddle, MD  03/05/2021, 3:16 PM

## 2021-03-05 NOTE — ED Provider Notes (Signed)
[facility] EMERGENCY DEPARTMENT Provider Note   CSN: [id number] Arrival date & time: 03/05/21  1332     History  Chief Complaint  Patient presents with   POST CPR    Alex Robinson is a 88 y.o. male.  Pt is a 88 yo wm with a hx of htn, high cholesterol, dementia, and dm. He presents to the ED today as a witnessed arrest by caregiver.  Caregiver said he was normal this morning and around 1230, he started having what sounds like several syncopal events which he would recover from.  Then, he did not "wake up."  Pt's initial rhythm was asystole.  CPR was started and pt had multiple returns of ROSC.  Pt is now awake and alert and on an epi drip.  Pt has multiple rounds of CPR.    Pt has no cp or any symptoms now.        Home Medications Prior to Admission medications   Medication Sig Start Date End Date Taking? Authorizing Provider  albuterol (VENTOLIN HFA) 108 (90 Base) MCG/ACT inhaler Inhale 2 puffs into the lungs 4 (four) times daily as needed for shortness of breath or wheezing. 02/27/21  Yes [provider]  amLODipine (NORVASC) 5 MG tablet TAKE 1 TABLET BY MOUTH EVERY DAY Patient taking differently: Take 5 mg by mouth daily. 09/01/14  Yes Renato Shin, MD  aspirin 81 MG tablet Take 81 mg by mouth daily.     Yes [provider]  atorvastatin (LIPITOR) 80 MG tablet 1 tab daily *APPOINTMENT NEEDED FOR FURTHER REFILLS* 05/03/16  Yes Renato Shin, MD  cetirizine (ZYRTEC) 10 MG tablet Take 10 mg by mouth at bedtime.   Yes [provider]  donepezil (ARICEPT) 10 MG tablet TAKE 1 TABLET (10 MG TOTAL) BY MOUTH AT BEDTIME. Patient taking differently: Take 10 mg by mouth at bedtime. 12/27/15  Yes Renato Shin, MD  metoprolol succinate (TOPROL-XL) 25 MG 24 hr tablet Take 25 mg by mouth daily. 03/04/21  Yes [provider]  tamsulosin (FLOMAX) 0.4 MG CAPS capsule TAKE 1 CAPSULE BY MOUTH TWO TIMES A DAY Patient taking differently: Take 0.4 mg  by mouth 2 (two) times daily. 09/01/14  Yes Renato Shin, MD  Blood Glucose Monitoring Suppl (ONE TOUCH ULTRA SYSTEM KIT) W/DEVICE KIT 1 kit by Does not apply route once. 01/25/13   Renato Shin, MD  glucose blood (ONE TOUCH ULTRA TEST) test strip 1 each by Other route daily. And lancets 1/day 250.00 01/25/13   Renato Shin, MD  potassium chloride (K-DUR) 10 MEQ tablet Take 1 tablet (10 mEq total) by mouth daily. 08/17/12 08/19/13  Renato Shin, MD      Allergies    Niacin and Sulfamethoxazole-trimethoprim    Review of Systems   Review of Systems  Neurological:  Positive for syncope.  All other systems reviewed and are negative.  Physical Exam Updated Vital Signs BP (!) 176/86    Pulse 69    Temp 98.2 F (36.8 C) (Oral)    Resp 11    Ht 5' 9" (1.753 m)    Wt 78 kg    SpO2 96%    BMI 25.39 kg/m  Physical Exam Vitals and nursing note reviewed.  Constitutional:      Appearance: Normal appearance.  HENT:     Head: Normocephalic and atraumatic.     Right Ear: External ear normal.     Left Ear: External ear normal.     Nose:  Nose normal.     Mouth/Throat:     Mouth: Mucous membranes are dry.  Eyes:     Extraocular Movements: Extraocular movements intact.     Conjunctiva/sclera: Conjunctivae normal.     Pupils: Pupils are equal, round, and reactive to light.  Cardiovascular:     Rate and Rhythm: Normal rate and regular rhythm.     Pulses: Normal pulses.     Heart sounds: Normal heart sounds.  Pulmonary:     Effort: Pulmonary effort is normal.     Breath sounds: Normal breath sounds.  Abdominal:     General: Abdomen is flat. Bowel sounds are normal.     Palpations: Abdomen is soft.  Musculoskeletal:        General: Normal range of motion.     Cervical back: Normal range of motion and neck supple.  Skin:    General: Skin is warm.     Capillary Refill: Capillary refill takes less than 2 seconds.  Neurological:     General: No focal deficit present.     Mental Status: He is  alert. Mental status is at baseline.     Motor: Tremor present.  Psychiatric:        Mood and Affect: Mood normal.    ED Results / Procedures / Treatments   Labs (all labs ordered are listed, but only abnormal results are displayed) Labs Reviewed  CBC - Abnormal; Notable for the following components:      Result Value   WBC 12.3 (*)    All other components within normal limits  RESP PANEL BY RT-PCR (FLU A&B, COVID) [id number]  COMPREHENSIVE METABOLIC PANEL  MAGNESIUM  TROPONIN I (HIGH SENSITIVITY)  TROPONIN I (HIGH SENSITIVITY)    EKG EKG Interpretation  Date/Time:  Monday March 05 2021 13:40:35 EST Ventricular Rate:  70 PR Interval:    QRS Duration: 132 QT Interval:  449 QTC Calculation: 485 R Axis:   -69 Text Interpretation: RBBB and LAFB no stemi Confirmed by Isla Pence [phone]) on 03/05/2021 1:46:22 PM  Radiology DG Chest Port 1 View  Result Date: 03/05/2021 CLINICAL DATA:  Status post cardiac arrest EXAM: PORTABLE CHEST 1 VIEW COMPARISON:  02/01/2015 FINDINGS: Transverse diameter of heart is increased. Thoracic aorta is tortuous and ectatic. There are no signs of pulmonary edema or focal pulmonary consolidation. There is no pleural effusion or pneumothorax. IMPRESSION: Cardiomegaly. There are no signs of pulmonary edema or focal pulmonary consolidation. Electronically Signed   By: Elmer Picker M.D.   On: 03/05/2021 14:00    Procedures Procedures    Medications Ordered in ED Medications  heparin injection 5,000 Units (has no administration in time range)  sodium chloride flush (NS) 0.9 % injection 3 mL (has no administration in time range)  sodium chloride flush (NS) 0.9 % injection 3 mL (has no administration in time range)  0.9 %  sodium chloride infusion (has no administration in time range)  docusate sodium (COLACE) capsule 100 mg (has no administration in time range)  acetaminophen (TYLENOL) tablet 650 mg (has no administration in time range)    Or   acetaminophen (TYLENOL) suppository 650 mg (has no administration in time range)  insulin aspart (novoLOG) injection 0-9 Units (has no administration in time range)    ED Course/ Medical Decision Making/ A&P                           Medical Decision Making  Pt is awake  and alert.  Pt's son [facility]) aware of plan for cardiology consult and admission.  Pt remains stable.  He is d/w Dr. Doylene Canard (cards) who will admit.        Final Clinical Impression(s) / ED Diagnoses Final diagnoses:  Cardiac arrest [facility])    Rx / DC Orders ED Discharge Orders     None         Isla Pence, MD 03/05/21 1625

## 2021-03-05 NOTE — ED Notes (Signed)
Pt resting in stretcher, had dinner tray and beverages. Assisted with repositioning, pt laying with eyes closed. VSS and monitoring equipment in place. Call light in reach.

## 2021-03-05 NOTE — ED Notes (Signed)
Pt fed the remainder of the his meal tray and peaches

## 2021-03-05 NOTE — ED Triage Notes (Signed)
Pt from home with caregiver. EMS initially called out for AMS, then witnessed arrest with CPR starting at 1242. EMS achieved ROSC "multiple times," final ROSC achieved 1258. Pt here on epi gtt @ 88mcg/min. 176/74BP, HR 72 for EMS.  Pt with hx dementia, poor historian. Also hx of parkinsons with tremors at baseline.

## 2021-03-05 NOTE — ED Notes (Signed)
Pt got out of bed and standing at doorway - pt bed changed, placed back on monitor, and placed on posey monitor for safety. Pt brief soaked, pt changed, some redness,breakdown noted on buttox, and placed on male primofit - pt resting in bed at this time

## 2021-03-05 NOTE — ED Notes (Signed)
Pt continues to pull himself off of the cardiac monitor - placed back on monitor again and reminded to try and keep it on

## 2021-03-05 NOTE — ED Notes (Signed)
Pt has remained stable with irregular HR in the 50s to 60s. Cardiology rounded on pt. Pt's son and POA is at bedside at this time. VSS and no distress noted. Pt is on RA. Per son pt's mentation is back to baseline. Call light in reach.

## 2021-03-05 NOTE — ED Notes (Signed)
Get patient into a gown on the monitor di d ekg shown to Dr Gilford Raid patient is resting with nurse at bedside and call bell in reach

## 2021-03-05 NOTE — ED Notes (Signed)
Pt continuously trying to get out of bed, stating that he needs to go home - pt laid back in bed and back on monitor - MD Doylene Canard asked for medicine at this time

## 2021-03-05 NOTE — ED Notes (Addendum)
Pt continues to pull off monitoring - pt O2 probe placed on ear at this time

## 2021-03-06 ENCOUNTER — Inpatient Hospital Stay (HOSPITAL_COMMUNITY): Payer: Medicare HMO

## 2021-03-06 LAB — BASIC METABOLIC PANEL
Anion gap: 10 (ref 5–15)
BUN: 27 mg/dL — ABNORMAL HIGH (ref 8–23)
CO2: 24 mmol/L (ref 22–32)
Calcium: 8.5 mg/dL — ABNORMAL LOW (ref 8.9–10.3)
Chloride: 105 mmol/L (ref 98–111)
Creatinine, Ser: 1.3 mg/dL — ABNORMAL HIGH (ref 0.61–1.24)
GFR, Estimated: 51 mL/min — ABNORMAL LOW (ref >60)
Glucose, Bld: 91 mg/dL (ref 70–99)
Potassium: 3.5 mmol/L (ref 3.5–5.1)
Sodium: 139 mmol/L (ref 135–145)

## 2021-03-06 LAB — CBG MONITORING, ED
Glucose-Capillary: 95 mg/dL (ref 70–99)
Glucose-Capillary: 88 mg/dL (ref 70–99)

## 2021-03-06 LAB — ECHOCARDIOGRAM COMPLETE
AR max vel: 1.72 cm2
AV Area VTI: 1.92 cm2
AV Area mean vel: 1.82 cm2
AV Mean grad: 5 mmHg
AV Peak grad: 9.6 mmHg
Ao pk vel: 1.55 m/s
Area-P 1/2: 1.96 cm2
Calc EF: 69.5 %
Height: 69 in
P 1/2 time: 1014 msec
S' Lateral: 1.5 cm
Single Plane A2C EF: 70.1 %
Single Plane A4C EF: 69.8 %
Weight: 2751.34 oz

## 2021-03-06 LAB — CBC
HCT: 38.3 % — ABNORMAL LOW (ref 39.0–52.0)
Hemoglobin: 12.5 g/dL — ABNORMAL LOW (ref 13.0–17.0)
MCH: 30.9 pg (ref 26.0–34.0)
MCHC: 32.6 g/dL (ref 30.0–36.0)
MCV: 94.8 fL (ref 80.0–100.0)
Platelets: 219 10*3/uL (ref 150–400)
RBC: 4.04 MIL/uL — ABNORMAL LOW (ref 4.22–5.81)
RDW: 13.7 % (ref 11.5–15.5)
WBC: 8.6 10*3/uL (ref 4.0–10.5)
nRBC: 0 % (ref 0.0–0.2)

## 2021-03-06 LAB — GLUCOSE, CAPILLARY
Glucose-Capillary: 147 mg/dL — ABNORMAL HIGH (ref 70–99)
Glucose-Capillary: 84 mg/dL (ref 70–99)

## 2021-03-06 MED ORDER — MELATONIN 5 MG PO TABS
5.0000 mg | ORAL_TABLET | Freq: Once | ORAL | Status: AC | PRN
  Administered 2021-03-06: 5 mg via ORAL
  Filled 2021-03-06 (×2): qty 1

## 2021-03-06 MED ORDER — ALPRAZOLAM 0.5 MG PO TABS
0.5000 mg | ORAL_TABLET | Freq: Once | ORAL | Status: AC
  Administered 2021-03-06: 0.5 mg via ORAL
  Filled 2021-03-06: qty 1

## 2021-03-06 MED ORDER — DIPHENHYDRAMINE HCL 25 MG PO CAPS
25.0000 mg | ORAL_CAPSULE | Freq: Once | ORAL | Status: AC
  Administered 2021-03-06: 25 mg via ORAL
  Filled 2021-03-06: qty 1

## 2021-03-06 MED ORDER — ACETAMINOPHEN 500 MG PO TABS
500.0000 mg | ORAL_TABLET | Freq: Once | ORAL | Status: AC
  Administered 2021-03-06: 500 mg via ORAL
  Filled 2021-03-06: qty 1

## 2021-03-06 NOTE — Progress Notes (Signed)
Ref: Nolene Ebbs, MD   Subjective:  Awake. Confused. Needs sitter to watch and prevent fall. Echocardiogram shows normal LV systolic function with mild aortic valve stenosis and mild MR. BMET and CBC are near normal. Monitor shows NSR.  Objective:  Vital Signs in the last 24 hours: Temp:  [99.2 F (37.3 C)] 99.2 F (37.3 C) (01/10 0019) Pulse Rate:  [52-173] 59 (01/10 1505) Cardiac Rhythm: Normal sinus rhythm (01/10 1903) Resp:  [13-21] 18 (01/10 1505) BP: (122-158)/(49-87) 158/82 (01/10 1907) SpO2:  [95 %-100 %] 98 % (01/10 1907)  Physical Exam: BP Readings from Last 1 Encounters:  03/06/21 (!) 158/82     Wt Readings from Last 1 Encounters:  03/05/21 78 kg    Weight change:  Body mass index is 25.39 kg/m. HEENT: [place]/AT, Eyes-Blue, Conjunctiva-Pink, Sclera-Non-icteric Neck: No JVD, No bruit, Trachea midline. Lungs:  Clear, Bilateral. Cardiac:  Regular rhythm, normal S1 and S2, no S3. II/VI systolic murmur. Abdomen:  Soft, non-tender. BS present. Extremities:  No edema present. No cyanosis. No clubbing. CNS: AxOx1, Cranial nerves grossly intact, moves all 4 extremities.  Skin: Warm and dry.   Intake/Output from previous day: 01/09 0701 - 01/10 0700 In: -  Out: 200 [Urine:200]    Lab Results: BMET    Component Value Date/Time   NA 139 03/06/2021 0447   NA 142 03/05/2021 1500   NA 142 04/04/2020 1008   K 3.5 03/06/2021 0447   K 3.7 03/05/2021 1500   K 4.8 04/04/2020 1008   CL 105 03/06/2021 0447   CL 106 03/05/2021 1500   CL 102 04/04/2020 1008   CO2 24 03/06/2021 0447   CO2 27 03/05/2021 1500   CO2 28 04/04/2020 1008   GLUCOSE 91 03/06/2021 0447   GLUCOSE 127 (H) 03/05/2021 1500   GLUCOSE 81 04/04/2020 1008   BUN 27 (H) 03/06/2021 0447   BUN 28 (H) 03/05/2021 1500   BUN 31 (H) 04/04/2020 1008   CREATININE 1.30 (H) 03/06/2021 0447   CREATININE 1.50 (H) 03/05/2021 1500   CREATININE 1.37 (H) 04/04/2020 1008   CREATININE 1.44 02/01/2015 1034    CALCIUM 8.5 (L) 03/06/2021 0447   CALCIUM 8.9 03/05/2021 1500   CALCIUM 9.8 04/04/2020 1008   GFRNONAA 51 (L) 03/06/2021 0447   GFRNONAA 43 (L) 03/05/2021 1500   GFRNONAA 44 (L) 04/04/2020 1008   GFRNONAA 60 (L) 05/15/2010 1420   GFRNONAA 57 (L) 03/26/2010 0945   GFRNONAA 46 (L) 02/06/2009 1045   GFRAA 52 (L) 04/04/2020 1008   GFRAA  05/15/2010 1420    >60        The eGFR has been calculated using the MDRD equation. This calculation has not been validated in all clinical situations. eGFR's persistently <60 mL/min signify possible Chronic Kidney Disease.   GFRAA  03/26/2010 0945    >60        The eGFR has been calculated using the MDRD equation. This calculation has not been validated in all clinical situations. eGFR's persistently <60 mL/min signify possible Chronic Kidney Disease.   GFRAA (L) 02/06/2009 1045    56        The eGFR has been calculated using the MDRD equation. This calculation has not been validated in all clinical situations. eGFR's persistently <60 mL/min signify possible Chronic Kidney Disease.   CBC    Component Value Date/Time   WBC 8.6 03/06/2021 0447   RBC 4.04 (L) 03/06/2021 0447   HGB 12.5 (L) 03/06/2021 0447   HCT 38.3 (L)  03/06/2021 0447   PLT 219 03/06/2021 0447   MCV 94.8 03/06/2021 0447   MCH 30.9 03/06/2021 0447   MCHC 32.6 03/06/2021 0447   RDW 13.7 03/06/2021 0447   LYMPHSABS 2.0 02/01/2015 1034   MONOABS 1.3 (H) 02/01/2015 1034   EOSABS 0.6 02/01/2015 1034   BASOSABS 0.0 02/01/2015 1034   HEPATIC Function Panel Recent Labs    04/04/20 1008 03/05/21 1500  PROT 7.1 7.5   HEMOGLOBIN A1C No components found for: HGA1C,  MPG CARDIAC ENZYMES No results found for: CKTOTAL, CKMB, CKMBINDEX, TROPONINI BNP No results for input(s): PROBNP in the last 8760 hours. TSH Recent Labs    04/04/20 1008  TSH 2.48   CHOLESTEROL Recent Labs    04/04/20 1008  CHOL 139    Scheduled Meds:  docusate sodium  100 mg Oral BID    heparin  5,000 Units Subcutaneous Q8H   insulin aspart  0-9 Units Subcutaneous TID WC   sodium chloride flush  3 mL Intravenous Q12H   Continuous Infusions:  sodium chloride     PRN Meds:.sodium chloride, acetaminophen **OR** acetaminophen, sodium chloride flush  Assessment/Plan:  Cardiac arrest survivor Syncope HTN HLD Type 2 DM Moderate dementia  Plan: Continue cardiac telemetry. Discuss care with EP doctor.   LOS: 1 day   Time spent including chart review, lab review, examination, discussion with patient/Nurse : 30 min   Dixie Dials  MD  03/06/2021, 9:45 PM

## 2021-03-06 NOTE — ED Notes (Addendum)
Per MD Doylene Canard Try Tylenol PM if that do not work then try Melatonin 5 mg - Hilary Hertz with pharmacy consulted for help with putting in orders

## 2021-03-06 NOTE — ED Notes (Signed)
MD Doylene Canard asked to upgrade level of care

## 2021-03-06 NOTE — ED Notes (Signed)
Pt is continuously pulling all cords and lines off.  Bilateral mittens were placed on pt.  Pt personal caretaker is at bedside

## 2021-03-06 NOTE — ED Notes (Signed)
Check patient Alex Robinson it was 66 notified RN of blood sugar gave patient his banana and orange juice and patient drunk some of his milk patient is resting with call bell in reach and a warm blanket

## 2021-03-06 NOTE — Progress Notes (Signed)
° °  Echocardiogram 2D Echocardiogram has been performed.  Alex Robinson 03/06/2021, 8:23 AM

## 2021-03-06 NOTE — TOC Progression Note (Signed)
Transition of Care [facility]) - Progression Note    Patient Details  Name: Alex Robinson MRN: [id number] Date of Birth: [birth year]  [facility]) CM/SW Contact  Zenon Mayo, RN Phone Number: 03/06/2021, 4:20 PM  Clinical Narrative:     Transition of Care [facility]) Screening Note   Patient Details  Name: Alex Robinson Date of Birth: 1925-06-11   Transition of Care [facility]) CM/SW Contact:    Zenon Mayo, RN Phone Number: 03/06/2021, 4:20 PM    Transition of Care Department [facility]) has reviewed patient and no TOC needs have been identified at this time. We will continue to monitor patient advancement through interdisciplinary progression rounds. If new patient transition needs arise, please place a TOC consult.          Expected Discharge Plan and Services                                                 Social Determinants of Health (SDOH) Interventions    Readmission Risk Interventions No flowsheet data found.

## 2021-03-06 NOTE — ED Notes (Signed)
Echo at bedside

## 2021-03-07 LAB — GLUCOSE, CAPILLARY
Glucose-Capillary: 83 mg/dL (ref 70–99)
Glucose-Capillary: 96 mg/dL (ref 70–99)
Glucose-Capillary: 105 mg/dL — ABNORMAL HIGH (ref 70–99)

## 2021-03-07 MED ORDER — TAMSULOSIN HCL 0.4 MG PO CAPS
0.4000 mg | ORAL_CAPSULE | Freq: Every day | ORAL | Status: DC
  Administered 2021-03-07: 0.4 mg via ORAL
  Filled 2021-03-07: qty 1

## 2021-03-07 MED ORDER — ALPRAZOLAM 0.25 MG PO TABS
0.2500 mg | ORAL_TABLET | Freq: Two times a day (BID) | ORAL | Status: DC
  Administered 2021-03-07 – 2021-03-08 (×3): 0.25 mg via ORAL
  Filled 2021-03-07 (×3): qty 1

## 2021-03-07 MED ORDER — ASPIRIN EC 81 MG PO TBEC
81.0000 mg | DELAYED_RELEASE_TABLET | Freq: Every day | ORAL | Status: DC
  Administered 2021-03-07 – 2021-03-08 (×2): 81 mg via ORAL
  Filled 2021-03-07 (×2): qty 1

## 2021-03-07 MED ORDER — DONEPEZIL HCL 10 MG PO TABS
10.0000 mg | ORAL_TABLET | Freq: Every day | ORAL | Status: DC
  Administered 2021-03-07: 10 mg via ORAL
  Filled 2021-03-07: qty 1

## 2021-03-07 MED ORDER — ATORVASTATIN CALCIUM 40 MG PO TABS
40.0000 mg | ORAL_TABLET | Freq: Every day | ORAL | Status: DC
  Administered 2021-03-07 – 2021-03-08 (×2): 40 mg via ORAL
  Filled 2021-03-07 (×2): qty 1

## 2021-03-07 MED ORDER — ALBUTEROL SULFATE (2.5 MG/3ML) 0.083% IN NEBU: 2.5000 mg | INHALATION_SOLUTION | Freq: Four times a day (QID) | RESPIRATORY_TRACT | Status: DC | PRN

## 2021-03-07 NOTE — Progress Notes (Signed)
Ref: Nolene Ebbs, MD   Subjective:  Awake. Improved interaction with staff post xanax use. VS stable. Monitor shows sinus rhythm. Discuss care with EP for ICD. Due to advanced age and dementia, medical treatment is recommended.  Objective:  Vital Signs in the last 24 hours: Temp:  [97.9 F (36.6 C)-98.6 F (37 C)] 97.9 F (36.6 C) (01/11 0336) Pulse Rate:  [59-72] 72 (01/11 0716) Cardiac Rhythm: Normal sinus rhythm (01/11 0723) Resp:  [18] 18 (01/11 0716) BP: (153-163)/(76-84) 157/76 (01/11 0716) SpO2:  [96 %-100 %] 96 % (01/11 0716) Weight:  [65.2 kg] 65.2 kg (01/11 0336)  Physical Exam: BP Readings from Last 1 Encounters:  03/07/21 (!) 157/76     Wt Readings from Last 1 Encounters:  03/07/21 65.2 kg    Weight change: -12.8 kg Body mass index is 21.23 kg/m. HEENT: [place]/AT, Eyes-Blue, Conjunctiva-Pink, Sclera-Non-icteric Neck: No JVD, No bruit, Trachea midline. Lungs:  Clear, Bilateral. Cardiac:  Regular rhythm, normal S1 and S2, no S3. II/VI systolic murmur. Abdomen:  Soft, non-tender. BS present. Extremities:  No edema present. No cyanosis. No clubbing. Occasional tremors. CNS: AxOx1, Cranial nerves grossly intact, moves all 4 extremities.  Skin: Warm and dry.   Intake/Output from previous day: 01/10 0701 - 01/11 0700 In: 357 [P.O.:357] Out: 750 [Urine:750]    Lab Results: BMET    Component Value Date/Time   NA 139 03/06/2021 0447   NA 142 03/05/2021 1500   NA 142 04/04/2020 1008   K 3.5 03/06/2021 0447   K 3.7 03/05/2021 1500   K 4.8 04/04/2020 1008   CL 105 03/06/2021 0447   CL 106 03/05/2021 1500   CL 102 04/04/2020 1008   CO2 24 03/06/2021 0447   CO2 27 03/05/2021 1500   CO2 28 04/04/2020 1008   GLUCOSE 91 03/06/2021 0447   GLUCOSE 127 (H) 03/05/2021 1500   GLUCOSE 81 04/04/2020 1008   BUN 27 (H) 03/06/2021 0447   BUN 28 (H) 03/05/2021 1500   BUN 31 (H) 04/04/2020 1008   CREATININE 1.30 (H) 03/06/2021 0447   CREATININE 1.50 (H) 03/05/2021  1500   CREATININE 1.37 (H) 04/04/2020 1008   CREATININE 1.44 02/01/2015 1034   CALCIUM 8.5 (L) 03/06/2021 0447   CALCIUM 8.9 03/05/2021 1500   CALCIUM 9.8 04/04/2020 1008   GFRNONAA 51 (L) 03/06/2021 0447   GFRNONAA 43 (L) 03/05/2021 1500   GFRNONAA 44 (L) 04/04/2020 1008   GFRNONAA 60 (L) 05/15/2010 1420   GFRNONAA 57 (L) 03/26/2010 0945   GFRNONAA 46 (L) 02/06/2009 1045   GFRAA 52 (L) 04/04/2020 1008   GFRAA  05/15/2010 1420    >60        The eGFR has been calculated using the MDRD equation. This calculation has not been validated in all clinical situations. eGFR's persistently <60 mL/min signify possible Chronic Kidney Disease.   GFRAA  03/26/2010 0945    >60        The eGFR has been calculated using the MDRD equation. This calculation has not been validated in all clinical situations. eGFR's persistently <60 mL/min signify possible Chronic Kidney Disease.   GFRAA (L) 02/06/2009 1045    56        The eGFR has been calculated using the MDRD equation. This calculation has not been validated in all clinical situations. eGFR's persistently <60 mL/min signify possible Chronic Kidney Disease.   CBC    Component Value Date/Time   WBC 8.6 03/06/2021 0447   RBC 4.04 (L) 03/06/2021 0447  HGB 12.5 (L) 03/06/2021 0447   HCT 38.3 (L) 03/06/2021 0447   PLT 219 03/06/2021 0447   MCV 94.8 03/06/2021 0447   MCH 30.9 03/06/2021 0447   MCHC 32.6 03/06/2021 0447   RDW 13.7 03/06/2021 0447   LYMPHSABS 2.0 02/01/2015 1034   MONOABS 1.3 (H) 02/01/2015 1034   EOSABS 0.6 02/01/2015 1034   BASOSABS 0.0 02/01/2015 1034   HEPATIC Function Panel Recent Labs    04/04/20 1008 03/05/21 1500  PROT 7.1 7.5   HEMOGLOBIN A1C No components found for: HGA1C,  MPG CARDIAC ENZYMES No results found for: CKTOTAL, CKMB, CKMBINDEX, TROPONINI BNP No results for input(s): PROBNP in the last 8760 hours. TSH Recent Labs    04/04/20 1008  TSH 2.48   CHOLESTEROL Recent Labs     04/04/20 1008  CHOL 139    Scheduled Meds:  ALPRAZolam  0.25 mg Oral BID   aspirin EC  81 mg Oral Daily   atorvastatin  40 mg Oral Daily   docusate sodium  100 mg Oral BID   donepezil  10 mg Oral QHS   heparin  5,000 Units Subcutaneous Q8H   sodium chloride flush  3 mL Intravenous Q12H   tamsulosin  0.4 mg Oral QPC supper   Continuous Infusions:  sodium chloride     PRN Meds:.sodium chloride, acetaminophen **OR** acetaminophen, albuterol, sodium chloride flush  Assessment/Plan:  Cardiac arrest survivor Syncope HTN HLD Type 2 DM, diet controlled Moderate dementia Essential tremors  Plan: Xanax 0.25 mg. Bid. Discussed care with son.   LOS: 2 days   Time spent including chart review, lab review, examination, discussion with patient/Nurse/Son : 30 min   Dixie Dials  MD  03/07/2021, 2:04 PM

## 2021-03-07 NOTE — Progress Notes (Signed)
RN spoke with this patient's son, Octavia Bruckner. Octavia Bruckner is requesting that cardiology call him with an update. RN notified Dr. Doylene Canard. Tim's phone number is in patient's chart. Edwena Blow, RN

## 2021-03-08 MED ORDER — TAMSULOSIN HCL 0.4 MG PO CAPS: 0.4000 mg | ORAL_CAPSULE | Freq: Every day | ORAL | Status: AC

## 2021-03-08 MED ORDER — ALPRAZOLAM 0.25 MG PO TABS: 0.2500 mg | ORAL_TABLET | Freq: Two times a day (BID) | ORAL | 3 refills | Status: AC

## 2021-03-08 MED ORDER — ATORVASTATIN CALCIUM 40 MG PO TABS
40.0000 mg | ORAL_TABLET | Freq: Every day | ORAL | 6 refills | Status: AC
Start: 2021-03-09 — End: ?

## 2021-03-08 MED ORDER — DOCUSATE SODIUM 100 MG PO CAPS: 100.0000 mg | ORAL_CAPSULE | Freq: Two times a day (BID) | ORAL | 0 refills | Status: AC

## 2021-03-08 NOTE — TOC Transition Note (Signed)
Transition of Care [facility]) - CM/SW Discharge Note   Patient Details  Name: Alex Robinson MRN: [id number] Date of Birth: 26-Jun-1925  [facility] CM/SW Contact:  Zenon Mayo, RN Phone Number: 03/08/2021, 12:37 PM   Clinical Narrative:    Patient is from home, NCM spoke with son at the bedside, he has two caregivers at bedside as well that do the 12 hrs for him at home.  He does not need any DME per son, he has Rails in the bathroom that he uses.  Secretary tried to make follow up apts but did not get an answer, Son said he will make the follow up apt for his father. He has a rolling walker at home and his aide also has one that he can use.     Final next level of care: [place] Barriers to Discharge: No Barriers Identified   Patient Goals and CMS Choice Patient states their goals for this hospitalization and ongoing recovery are:: return home with 12 hr caregivers   Choice offered to / list presented to : NA  Discharge Placement                       Discharge Plan and Services                  DME Agency: NA       HH Arranged: NA          Social Determinants of Health (SDOH) Interventions     Readmission Risk Interventions No flowsheet data found.

## 2021-03-08 NOTE — Discharge Summary (Signed)
Physician Discharge Summary  Patient ID: Alex Robinson MRN: [id number] DOB/AGE: 10/03/1925 88 y.o.  Admit date: 03/05/2021 Discharge date: 03/08/2021  Admission Diagnoses: Syncope Cardiac arrest survivor HTN HLD Type 2 DM Mild dementia  Discharge Diagnoses:  Principal Problem: Cardiac arrest Active problem:   Syncope   HTN   HLD   Type 2 DM, diet controlled   Moderate dementia   Essential tremors   CKD, II  Discharged Condition: fair  Hospital Course: 88 years old white male with PMH of Asthma, type 2 DM, HTN, HLD and tremors had several episodes of syncope followed by cardiac arrest requiring CPR few times with ROSC (Return Of Spontaneous Circulation). His Beta blocker and amlodipine were discontinued with improvement in BP and heart rate. He had no additional rhythm abnormality.EP curb side consult recommended medical therapy for now. 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  He was discharged home in stable condition with fu by primary care in 1 week and by me in 1 month.  Consults: cardiology  Significant Diagnostic Studies: labs: CBC, BMET were near normal except slightly high blood sugar and creatinine was 1.30 to 1.50 mg. Troponin-I was minimally elevated..  Treatments: Discontinuation of amlodipine and metoprolol. 50 % decrease in Tamsulosin dose.   Discharge Exam: Blood pressure 109/63, pulse 66, temperature 98.1 F (36.7 C), temperature source Axillary, resp. rate 20, height _0  (1.753 m), weight 65.2 kg, SpO2 97 %. General appearance: alert, cooperative and appears stated age. Head: Normocephalic, atraumatic. Eyes: Blue eyes, pink conjunctiva, corneas clear.  Neck: No adenopathy, no carotid bruit, no JVD, supple, symmetrical, trachea midline and thyroid not enlarged. Resp: Clear to auscultation bilaterally. Cardio: Regular rate and rhythm, S1, S2 normal, II/VI systolic murmur, no click, rub or gallop. GI: Soft,  non-tender; bowel sounds normal; no organomegaly. Extremities: No edema, cyanosis or clubbing. Skin: Warm and dry.  Neurologic: Alert and oriented X 1, normal strength and tone.  Disposition: Discharge disposition: 01-Home or Self Care        Allergies as of 03/08/2021       Reactions   Niacin Hives   Sulfamethoxazole-trimethoprim Hives        Medication List     STOP taking these medications    amLODipine 5 MG tablet Commonly known as: NORVASC   cetirizine 10 MG tablet Commonly known as: ZYRTEC       TAKE these medications    albuterol 108 (90 Base) MCG/ACT inhaler Commonly known as: VENTOLIN HFA Inhale 2 puffs into the lungs 4 (four) times daily as needed for shortness of breath or wheezing.   ALPRAZolam 0.25 MG tablet Commonly known as: XANAX Take 1 tablet (0.25 mg total) by mouth 2 (two) times daily.   aspirin 81 MG tablet Take 81 mg by mouth daily.   atorvastatin 40 MG tablet Commonly known as: LIPITOR Take 1 tablet (40 mg total) by mouth daily. Start taking on: March 09, 2021 What changed:  medication strength how much to take how to take this when to take this additional instructions   docusate sodium 100 MG capsule Commonly known as: COLACE Take 1 capsule (100 mg total) by mouth 2 (two) times daily.   donepezil 10 MG tablet Commonly known as: ARICEPT TAKE 1 TABLET (10 MG TOTAL) BY MOUTH AT BEDTIME.   glucose blood test strip Commonly known as: ONE TOUCH ULTRA TEST 1 each by Other route daily. And lancets 1/day 250.00   ONE TOUCH  ULTRA SYSTEM KIT w/Device Kit 1 kit by Does not apply route once.   tamsulosin 0.4 MG Caps capsule Commonly known as: FLOMAX Take 1 capsule (0.4 mg total) by mouth daily after supper. TAKE 1 CAPSULE BY MOUTH TWO TIMES A DAY What changed:  how much to take how to take this when to take this        Follow-up Information     Nolene Ebbs, MD Follow up.   Specialty: Internal Medicine Why: Please  follow up in a week. Contact information: [place] [address] [phone]         Dixie Dials, MD Follow up in 1 month(s).   Specialty: Cardiology Contact information: [place] [address] [phone]                 Time spent: Review of old chart, current chart, lab, x-ray, cardiac tests and discussion with patient over 60 minutes.  Signed: Birdie Riddle 03/08/2021, 12:24 PM

## 2021-03-08 NOTE — Progress Notes (Signed)
Per patient's son Octavia Bruckner, patient receives home medications through [employer]

## 2021-03-08 NOTE — Progress Notes (Signed)
Mobility Specialist Progress Note:   03/08/21 1245  Mobility  Activity Ambulated in hall  Level of Assistance Moderate assist, patient does 50-74%  Assistive Device Front wheel walker  Distance Ambulated (ft) 80 ft  Mobility Ambulated with assistance in hallway  Mobility Response Tolerated fair  Mobility performed by Mobility specialist  $Mobility charge 1 Mobility   Pt received in bed willing to participate in mobility. No complaints of pain. ModA to stand and throughout ambulation. Pt left in bed with call bell in reach and all needs met.   [facility] Public librarian Phone [phone] Secondary Phone [phone]

## 2021-03-22 ENCOUNTER — Other Ambulatory Visit: Payer: Self-pay | Admitting: Internal Medicine

## 2021-03-23 LAB — COMPLETE METABOLIC PANEL WITH GFR
AG Ratio: 1.4 (calc) (ref 1.0–2.5)
ALT: 15 U/L (ref 9–46)
AST: 19 U/L (ref 10–35)
Albumin: 3.8 g/dL (ref 3.6–5.1)
Alkaline phosphatase (APISO): 123 U/L (ref 35–144)
BUN/Creatinine Ratio: 20 (calc) (ref 6–22)
BUN: 29 mg/dL — ABNORMAL HIGH (ref 7–25)
CO2: 27 mmol/L (ref 20–32)
Calcium: 8.9 mg/dL (ref 8.6–10.3)
Chloride: 106 mmol/L (ref 98–110)
Creat: 1.46 mg/dL — ABNORMAL HIGH (ref 0.70–1.22)
Globulin: 2.8 g/dL (calc) (ref 1.9–3.7)
Glucose, Bld: 69 mg/dL (ref 65–99)
Potassium: 4.4 mmol/L (ref 3.5–5.3)
Sodium: 141 mmol/L (ref 135–146)
Total Bilirubin: 0.2 mg/dL (ref 0.2–1.2)
Total Protein: 6.6 g/dL (ref 6.1–8.1)
eGFR: 45 mL/min/{1.73_m2} — ABNORMAL LOW (ref >=60)

## 2021-03-23 LAB — LIPID PANEL
Cholesterol: 145 mg/dL (ref <200)
HDL: 38 mg/dL — ABNORMAL LOW (ref >=40)
LDL Cholesterol (Calc): 82 mg/dL (calc)
Non-HDL Cholesterol (Calc): 107 mg/dL (calc) (ref <130)
Total CHOL/HDL Ratio: 3.8 (calc) (ref <5.0)
Triglycerides: 158 mg/dL — ABNORMAL HIGH (ref <150)

## 2021-03-23 LAB — TSH: TSH: 2.05 mIU/L (ref 0.40–4.50)

## 2021-03-23 LAB — CBC
HCT: 38.8 % (ref 38.5–50.0)
Hemoglobin: 12.5 g/dL — ABNORMAL LOW (ref 13.2–17.1)
MCH: 30.3 pg (ref 27.0–33.0)
MCHC: 32.2 g/dL (ref 32.0–36.0)
MCV: 94.2 fL (ref 80.0–100.0)
MPV: 10.7 fL (ref 7.5–12.5)
Platelets: 294 10*3/uL (ref 140–400)
RBC: 4.12 10*6/uL — ABNORMAL LOW (ref 4.20–5.80)
RDW: 12.7 % (ref 11.0–15.0)
WBC: 9 10*3/uL (ref 3.8–10.8)

## 2021-04-09 ENCOUNTER — Other Ambulatory Visit (HOSPITAL_COMMUNITY): Payer: Self-pay | Admitting: Internal Medicine

## 2021-04-09 ENCOUNTER — Ambulatory Visit (HOSPITAL_COMMUNITY)
Admission: RE | Admit: 2021-04-09 | Discharge: 2021-04-09 | Disposition: A | Discharge status: Home / Self Care | Payer: Medicare HMO | Source: Ambulatory Visit | Attending: Internal Medicine | Admitting: Internal Medicine

## 2021-04-09 ENCOUNTER — Other Ambulatory Visit: Payer: Self-pay

## 2021-04-09 DIAGNOSIS — E1151 Type 2 diabetes mellitus with diabetic peripheral angiopathy without gangrene: Secondary | ICD-10-CM

## 2021-04-12 ENCOUNTER — Ambulatory Visit (INDEPENDENT_AMBULATORY_CARE_PROVIDER_SITE_OTHER): Payer: Medicare HMO | Admitting: Podiatry

## 2021-04-12 ENCOUNTER — Other Ambulatory Visit: Payer: Self-pay

## 2021-04-12 DIAGNOSIS — S91209A Unspecified open wound of unspecified toe(s) with damage to nail, initial encounter: Secondary | ICD-10-CM

## 2021-04-12 DIAGNOSIS — M79674 Pain in right toe(s): Secondary | ICD-10-CM

## 2021-04-12 DIAGNOSIS — S91202A Unspecified open wound of left great toe with damage to nail, initial encounter: Secondary | ICD-10-CM

## 2021-04-12 DIAGNOSIS — M069 Rheumatoid arthritis, unspecified: Secondary | ICD-10-CM | POA: Insufficient documentation

## 2021-04-12 DIAGNOSIS — M79675 Pain in left toe(s): Secondary | ICD-10-CM | POA: Diagnosis not present

## 2021-04-12 DIAGNOSIS — G250 Essential tremor: Secondary | ICD-10-CM | POA: Insufficient documentation

## 2021-04-12 DIAGNOSIS — B351 Tinea unguium: Secondary | ICD-10-CM | POA: Diagnosis not present

## 2021-04-12 DIAGNOSIS — E119 Type 2 diabetes mellitus without complications: Secondary | ICD-10-CM

## 2021-04-12 NOTE — Progress Notes (Signed)
°  Subjective:  Patient ID: Alex Robinson, male    DOB: 1925/07/01,  MRN: [id number]  Chief Complaint  Patient presents with   Cellulitis      np-cellulitis L hallux toe-non req-Dr. Nolene Ebbs refer    88 y.o. male presents with the above complaint. History confirmed with patient.  Here with a caregiver, he has significant dementia.  They think a toenail got caught on the sock and ripped the toenail off on the left foot.  His son was helping tape and on but this did not work.  Recently completed antibiotics from his primary care doctor.  Objective:  Physical Exam: warm, good capillary refill, no trophic changes or ulcerative lesions, normal DP and PT pulses, normal sensory exam, and previously avulsed hallux nail healed well no signs of infection or open wound, thick and dystrophic elongated toenails times 9 with yellow-brown discoloration.  Assessment:   1. Traumatic avulsion of nail plate of toe, initial encounter   2. Pain due to onychomycosis of toenails of both feet   3. Type 2 diabetes mellitus without complication, without long-term current use of insulin ([place])      Plan:  Patient was evaluated and treated and all questions answered.  Patient educated on diabetes. Discussed proper diabetic foot care and discussed risks and complications of disease. Educated patient in depth on reasons to return to the office immediately should he/she discover anything concerning or new on the feet. All questions answered. Discussed proper shoes as well.   Discussed the etiology and treatment options for the condition in detail with the patient. Educated patient on the topical and oral treatment options for mycotic nails. Recommended debridement of the nails today. Sharp and mechanical debridement performed of all painful and mycotic nails today. Nails debrided in length and thickness using a nail nipper to level of comfort. Discussed treatment options including appropriate shoe gear. Follow up as  needed for painful nails.  Left hallux nail avulsion appears to be healing well I do not see any further indication for oral antibiotics at this point.  Leave open to air and the nail should regrow on its own  Return in about 3 months (around 07/10/2021) for at risk diabetic foot care.

## 2021-06-15 ENCOUNTER — Encounter: Payer: Self-pay | Admitting: Internal Medicine

## 2021-06-25 DEATH — deceased

## 2021-07-11 ENCOUNTER — Ambulatory Visit: Payer: Medicare HMO | Admitting: Podiatry

## 2023-10-07 IMAGING — DX DG CHEST 1V PORT
1 series · 1 of 1 positions shown · non-contrast
Comparison: 02/01/2015

CLINICAL DATA: Status post cardiac arrest

EXAM:
PORTABLE CHEST 1 VIEW

[chest]
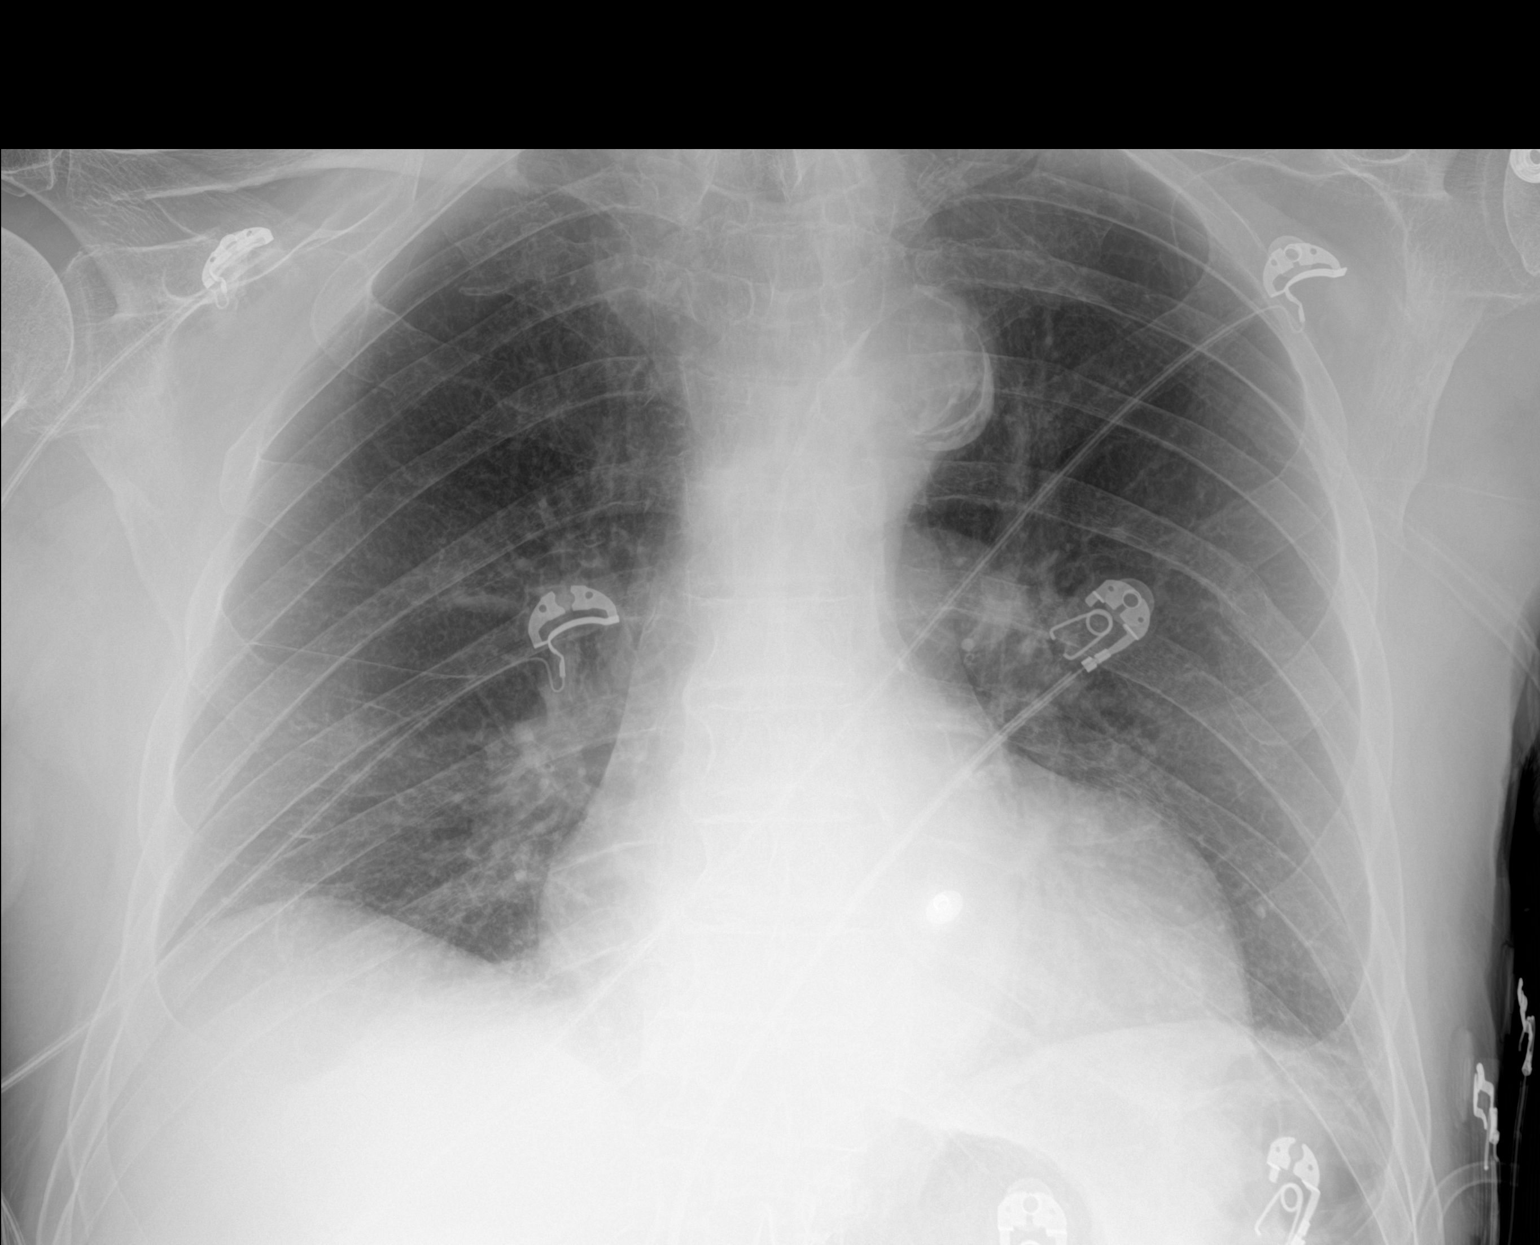

[1 of 1 positions shown; findings below may reference images not displayed]

FINDINGS: Transverse diameter of heart is increased. Thoracic aorta is
tortuous and ectatic. There are no signs of pulmonary edema or focal
pulmonary consolidation. There is no pleural effusion or
pneumothorax.
IMPRESSION: Cardiomegaly. There are no signs of pulmonary edema or focal
pulmonary consolidation.
